# Patient Record
Sex: Male | Born: 1940 | Race: White | Hispanic: No | Marital: Married | Smoking: Former smoker
Health system: Southern US, Community
[De-identification: ages and names within clinical notes are randomized; demographics above are authoritative.]

## PROBLEM LIST (undated history)

## (undated) DIAGNOSIS — D494 Neoplasm of unspecified behavior of bladder: Secondary | ICD-10-CM

## (undated) DIAGNOSIS — I639 Cerebral infarction, unspecified: Secondary | ICD-10-CM

## (undated) DIAGNOSIS — I2510 Atherosclerotic heart disease of native coronary artery without angina pectoris: Secondary | ICD-10-CM

## (undated) DIAGNOSIS — I219 Acute myocardial infarction, unspecified: Secondary | ICD-10-CM

## (undated) DIAGNOSIS — I723 Aneurysm of iliac artery: Secondary | ICD-10-CM

## (undated) DIAGNOSIS — Z87442 Personal history of urinary calculi: Secondary | ICD-10-CM

## (undated) DIAGNOSIS — M1990 Unspecified osteoarthritis, unspecified site: Secondary | ICD-10-CM

## (undated) DIAGNOSIS — R51 Headache: Secondary | ICD-10-CM

## (undated) DIAGNOSIS — I10 Essential (primary) hypertension: Secondary | ICD-10-CM

## (undated) DIAGNOSIS — N210 Calculus in bladder: Secondary | ICD-10-CM

## (undated) DIAGNOSIS — C801 Malignant (primary) neoplasm, unspecified: Secondary | ICD-10-CM

## (undated) DIAGNOSIS — K219 Gastro-esophageal reflux disease without esophagitis: Secondary | ICD-10-CM

## (undated) DIAGNOSIS — E785 Hyperlipidemia, unspecified: Secondary | ICD-10-CM

## (undated) HISTORY — PX: OTHER SURGICAL HISTORY: SHX169

## (undated) HISTORY — PX: EYE SURGERY: SHX253

## (undated) HISTORY — DX: Aneurysm of iliac artery: I72.3

---

## 1997-09-13 HISTORY — PX: HERNIA REPAIR: SHX51

## 2009-09-13 DIAGNOSIS — I219 Acute myocardial infarction, unspecified: Secondary | ICD-10-CM

## 2009-09-13 HISTORY — PX: PROSTATECTOMY: SHX69

## 2009-09-13 HISTORY — DX: Acute myocardial infarction, unspecified: I21.9

## 2009-09-13 HISTORY — PX: CORONARY ANGIOPLASTY: SHX604

## 2009-12-03 ENCOUNTER — Inpatient Hospital Stay (HOSPITAL_COMMUNITY): Admission: RE | Admit: 2009-12-03 | Discharge: 2009-12-04 | Payer: Self-pay | Admitting: Urology

## 2009-12-03 ENCOUNTER — Encounter (INDEPENDENT_AMBULATORY_CARE_PROVIDER_SITE_OTHER): Payer: Self-pay | Admitting: Urology

## 2010-09-13 HISTORY — PX: CARDIAC CATHETERIZATION: SHX172

## 2010-12-07 LAB — HEMOGLOBIN AND HEMATOCRIT, BLOOD
HCT: 39.5 % (ref 39.0–52.0)
HCT: 45.7 % (ref 39.0–52.0)
Hemoglobin: 14.7 g/dL (ref 13.0–17.0)

## 2010-12-07 LAB — CBC
HCT: 44.6 % (ref 39.0–52.0)
MCHC: 32.8 g/dL (ref 30.0–36.0)
MCV: 85.5 fL (ref 78.0–100.0)
Platelets: 225 10*3/uL (ref 150–400)
RDW: 14.9 % (ref 11.5–15.5)
WBC: 6.7 10*3/uL (ref 4.0–10.5)

## 2010-12-07 LAB — BASIC METABOLIC PANEL
BUN: 14 mg/dL (ref 6–23)
CO2: 27 mEq/L (ref 19–32)
Chloride: 107 mEq/L (ref 96–112)
Creatinine, Ser: 0.8 mg/dL (ref 0.4–1.5)
GFR calc Af Amer: >60 mL/min (ref >60)
Glucose, Bld: 97 mg/dL (ref 70–99)
Potassium: 4.6 mEq/L (ref 3.5–5.1)

## 2010-12-07 LAB — ABO/RH: ABO/RH(D): A POS

## 2010-12-07 LAB — TYPE AND SCREEN: ABO/RH(D): A POS

## 2010-12-07 LAB — PROTIME-INR: Prothrombin Time: 14.2 seconds (ref 11.6–15.2)

## 2013-12-06 ENCOUNTER — Other Ambulatory Visit: Payer: Self-pay | Admitting: Urology

## 2013-12-11 ENCOUNTER — Encounter (HOSPITAL_COMMUNITY): Payer: Self-pay | Admitting: Pharmacy Technician

## 2013-12-18 ENCOUNTER — Encounter (HOSPITAL_COMMUNITY): Payer: Self-pay

## 2013-12-18 ENCOUNTER — Ambulatory Visit (HOSPITAL_COMMUNITY)
Admission: RE | Admit: 2013-12-18 | Discharge: 2013-12-18 | Disposition: A | Discharge status: Home / Self Care | Payer: Medicare Other | Source: Ambulatory Visit | Attending: Urology | Admitting: Urology

## 2013-12-18 ENCOUNTER — Encounter (INDEPENDENT_AMBULATORY_CARE_PROVIDER_SITE_OTHER): Payer: Self-pay

## 2013-12-18 ENCOUNTER — Encounter (HOSPITAL_COMMUNITY)
Admission: RE | Admit: 2013-12-18 | Discharge: 2013-12-18 | Disposition: A | Discharge status: Home / Self Care | Payer: Medicare Other | Source: Ambulatory Visit | Attending: Urology | Admitting: Urology

## 2013-12-18 DIAGNOSIS — I10 Essential (primary) hypertension: Secondary | ICD-10-CM | POA: Insufficient documentation

## 2013-12-18 DIAGNOSIS — I2510 Atherosclerotic heart disease of native coronary artery without angina pectoris: Secondary | ICD-10-CM | POA: Insufficient documentation

## 2013-12-18 HISTORY — DX: Atherosclerotic heart disease of native coronary artery without angina pectoris: I25.10

## 2013-12-18 HISTORY — DX: Hyperlipidemia, unspecified: E78.5

## 2013-12-18 HISTORY — DX: Neoplasm of unspecified behavior of bladder: D49.4

## 2013-12-18 HISTORY — DX: Acute myocardial infarction, unspecified: I21.9

## 2013-12-18 HISTORY — DX: Personal history of urinary calculi: Z87.442

## 2013-12-18 HISTORY — DX: Headache: R51

## 2013-12-18 HISTORY — DX: Calculus in bladder: N21.0

## 2013-12-18 HISTORY — DX: Gastro-esophageal reflux disease without esophagitis: K21.9

## 2013-12-18 HISTORY — DX: Unspecified osteoarthritis, unspecified site: M19.90

## 2013-12-18 HISTORY — DX: Essential (primary) hypertension: I10

## 2013-12-18 HISTORY — DX: Malignant (primary) neoplasm, unspecified: C80.1

## 2013-12-18 LAB — BASIC METABOLIC PANEL
BUN: 15 mg/dL (ref 6–23)
CHLORIDE: 102 meq/L (ref 96–112)
CO2: 27 mEq/L (ref 19–32)
Calcium: 9.5 mg/dL (ref 8.4–10.5)
Creatinine, Ser: 0.78 mg/dL (ref 0.50–1.35)
GFR calc non Af Amer: 87 mL/min — ABNORMAL LOW (ref >90)
GLUCOSE: 92 mg/dL (ref 70–99)
POTASSIUM: 4.7 meq/L (ref 3.7–5.3)
Sodium: 141 mEq/L (ref 137–147)

## 2013-12-18 NOTE — Progress Notes (Signed)
12/18/13 1147  [place]  Have you ever been diagnosed with sleep apnea through a sleep study? No  Do you snore loudly (loud enough to be heard through closed doors)?  0  Do you often feel tired, fatigued, or sleepy during the daytime? 1  Has anyone observed you stop breathing during your sleep? 0  Do you have, or are you being treated for high blood pressure? 1  BMI more than 35 kg/m2? 0  Age over 75 years old? 1  Neck circumference greater than 40 cm/18 inches? 0  Gender: 1  Obstructive Sleep Apnea Score 4  Score 4 or greater  Results sent to PCP

## 2013-12-18 NOTE — Patient Instructions (Addendum)
Samuel Curtis  12/18/2013                           YOUR PROCEDURE IS SCHEDULED ON: 12/24/13               PLEASE REPORT TO SHORT STAY CENTER AT : 1:45 PM               CALL THIS NUMBER IF ANY PROBLEMS THE DAY OF SURGERY :               [phone]                                REMEMBER:   Do not eat food or drink liquids AFTER MIDNIGHT   May have clear liquids UNTIL 6 HOURS BEFORE SURGERY (10:00 AM)               Take these medicines the morning of surgery with A SIP OF WATER: AMLODIPINE / ISORSORBIDE   Do not wear jewelry, make-up   Do not wear lotions, powders, or perfumes.   Do not shave legs or underarms 12 hrs. before surgery (men may shave face)  Do not bring valuables to the hospital.  Contacts, dentures or bridgework may not be worn into surgery.  Leave suitcase in the car. After surgery it may be brought to your room.  For patients admitted to the hospital more than one night, checkout time is            11:00 AM                                                       The day of discharge.   Patients discharged the day of surgery will not be allowed to drive home.            If going home same day of surgery, must have someone stay with you              FIRST 24 hrs at home and arrange for some one to drive you              home from hospital.    Special Instructions             Please read over the following fact sheets that you were given:               1. [place]                                                X_____________________________________________________________________        Failure to follow these instructions may result in cancellation of your surgery

## 2013-12-21 NOTE — H&P (Signed)
History of Present Illness Samuel Curtis is a 73 year old with the following urologic history:  1) Prostate cancer: He is s/p a UNS RAL radical prostatectomy on 12/03/09 for locally advanced prostate cancer with a positive surgical margin. He discussed the option of adjuvant radiation with Dr. Danny Lawless and chose to proceed with PSA surveillance and utilize radiation for salvage therapy if necessary.   TNM stage: pT3a N0 Mx  Gleason score: 3+4=7 Surgical margins: Positive (Focal in area of R EPE) Pretreatment PSA: 4.55 Pretreatment SHIM: 7   2) Microscopic hematuria: He initially developed asymptomatic microscopic hematuria in August 2013 which was not persistent when rechecked. He has denied gross hematuria and has no family history of urothelial cancer. He did smoke in the distant past but quit over 30 years ago.  Interval history:  Samuel Curtis presents today after recently having been seen just a few weeks ago. He developed new onset painless gross hematuria at the end of last week. This was fairly severe and caused him to immediately call our office. He states that he has been attempting his bladder well and denies any associated symptoms including no fever, flank pain, abdominal pain, urgency, frequency, or worsening dysuria. He does continue to have baseline dysuria which apparently has been stable past few years. This is not particularly bothersome to him. He did smoke approximately 1 pack per day for 20 years prior to quitting over 30 years ago. There is no family history of GU malignancy except for a brother with prostate cancer.     Of note, he recently saw his cardiologist and was told to discontinue both his Plavix and aspirin. He is currently not taking any antiplatelet therapy.   Past Medical History Problems  1. History of Acute Myocardial Infarction (V12.59) 2. History of glaucoma (V12.49) 3. History of hypertension (V12.59) 4. Prostate cancer (185)  Surgical  History Problems  1. History of Cardiac Cath Procedure Outcome: 2. History of Cataract Surgery 3. History of Cath Stent Placement 4. History of Inguinal Hernia Repair 5. History of Prostatectomy Robotic-Assisted  Current Meds 1. AmLODIPine Besylate TABS;  Therapy: (Recorded:07Feb2011) to Recorded 2. Aspirin 81 MG Oral Tablet;  Therapy: (Recorded:16Jan2013) to Recorded 3. Furosemide 20 MG Oral Tablet;  Therapy: 23Apr2014 to Recorded 4. Isosorbide Mononitrate ER 30 MG Oral Tablet Extended Release 24 Hour;  Therapy: 20Aug2012 to Recorded 5. Nitrostat 0.4 MG Sublingual Tablet Sublingual;  Therapy: (Recorded:23Dec2011) to Recorded 6. Plavix TABS;  Therapy: (Recorded:23Dec2011) to Recorded 7. Pravastatin Sodium 40 MG Oral Tablet;  Therapy: (Recorded:23Dec2011) to Recorded 8. Ranitidine HCl - 300 MG Oral Tablet;  Therapy: 13Sep2011 to Recorded  Allergies Medication  1. No Known Drug Allergies  Family History Problems  1. Family history of Acute Myocardial Infarction (V17.3) : Brother 2. Family history of Nephrolithiasis : Father 3. Family history of Prostate Cancer (B44.96) : Brother  Social History Problems  1. Denied: History of Alcohol Use 2. Marital History - Currently Married 3. Occupation:   retired 28. Tobacco Use (V15.82)   1 ppd x 20 years =quit 30 years  Review of Systems  Genitourinary: hematuria, but no urinary frequency and no feelings of urinary urgency.  Constitutional: no fever.  Hematologic/Lymphatic: no tendency to easily bruise.  Cardiovascular: no chest pain and no leg swelling.    Vitals Vital Signs [Data Includes: Last 1 Day]  Recorded: [id number] 01:26PM  Weight: 240 lb  BMI Calculated: 32.55 BSA Calculated: 2.3 Blood Pressure: 126 / 75 Heart Rate: 76  Physical Exam  Constitutional: Well nourished and well developed . No acute distress.  ENT:. The ears and nose are normal in appearance.  Neck: The appearance of the neck is normal and no  neck mass is present.  Pulmonary: No respiratory distress and normal respiratory rhythm and effort.  Cardiovascular: Heart rate and rhythm are normal . No peripheral edema.  Abdomen: The abdomen is soft and nontender. No masses are palpated. No CVA tenderness. No hernias are palpable. No hepatosplenomegaly noted.  Genitourinary: Examination of the penis demonstrates no discharge, no masses, no lesions and a normal meatus. The scrotum is without lesions. The right epididymis is palpably normal and non-tender. The left epididymis is palpably normal and non-tender. The right testis is non-tender and without masses. The left testis is non-tender and without masses.  Lymphatics: The femoral and inguinal nodes are not enlarged or tender.  Skin: Normal skin turgor, no visible rash and no visible skin lesions.  Neuro/Psych:. Mood and affect are appropriate.    Results/Data Urine [Data Includes: Last 1 Day]   [id number]  COLOR YELLOW   APPEARANCE CLEAR   SPECIFIC GRAVITY 1.025   pH 6.0   GLUCOSE NEG mg/dL  BILIRUBIN NEG   KETONE NEG mg/dL  BLOOD TRACE   PROTEIN NEG mg/dL  UROBILINOGEN 1 mg/dL  NITRITE NEG   LEUKOCYTE ESTERASE NEG   SQUAMOUS EPITHELIAL/HPF RARE   WBC 0-2 WBC/hpf  RBC 3-6 RBC/hpf  BACTERIA RARE   CRYSTALS NONE SEEN   CASTS NONE SEEN   Other AMORPHOUS    Procedure  Procedure: Cystoscopy  Chaperone Present: Heather S.   Indication: Hematuria.  Informed Consent: Risks, benefits, and potential adverse events were discussed and informed consent was obtained from the patient.  Prep: The patient was prepped with betadine.  Anesthesia:. Local anesthesia was administered intraurethrally with 2% lidocaine jelly.  Antibiotic prophylaxis: Ciprofloxacin.  Procedure Note:  Urethral meatus:. No abnormalities.  Anterior urethra: No abnormalities.  Prostatic urethra:. Surgically absent.  Bladder: Visulization was clear. The ureteral orifices were in the normal anatomic position  bilaterally and had clear efflux of urine. Inspection of the bladder was performed systematically. There were multiple abnormalities. At the bladder neck, he was noted to have a bladder calculus measuring approximately 1 cm. There was significantly edematous mucosa surrounding the bladder neck. On further evaluation of the bladder, there were multiple papillary bladder tumors seen throughout the bladder with at least 8-10 bladder tumors identified. The largest of these tumors were noted toward the dome of the bladder and measured approximately 2.5 centimeters. A saline bladder washing was obtained and sent for cytologic analysis. The patient tolerated the procedure well.  Complications: None.    Assessment Assessed  1. Gross hematuria (599.71) 2. 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 URINE CYTOLOGY; Status:Hold For - Specimen/Data Collection,Appointment;  Requested for:23Mar2015;  Gross hematuria  2. AU CT-HEMATURIA PROTOCOL; Status:Hold For - Appointment,PreCert,Date of  Service,Print; Requested for:23Mar2015;  3. CREATININE with eGFR; Status:In Progress - Specimen/Data Collected;   Done:  [id number] 5. Cysto; Status:Hold For - Appointment,Date of Service; Requested for:23Mar2015;  5. VENIPUNCTURE; Status:Complete;   Done: [id number] 6. Follow-up Office  Follow-up - will call to schedule surgery  Status: Hold For -  Appointment,Date of Service  Requested for: 23Mar2015 Health Maintenance  7. UA With REFLEX; [Do Not Release]; Status:Complete;   Done: [id number] 01:19PM  Discussion/Summary 1. Bladder tumors consistent with bladder cancer/gross hematuria : He will complete his hematuria evaluation including a CT scan of the abdomen and  pelvis. I have reviewed the findings from his cystoscopic procedure today and recommended that he proceed with cystoscopy under anesthesia, pelvic exam under anesthesia, transurethral resection of his bladder tumors, possible retrograde pyelography,  possible ureteral stent placement, and removal of his bladder neck calculus. We have reviewed the potential complications and expected recovery process associated with this procedure. He currently is off all antiplatelet agents per his cardiologist. He understands that we might modify our procedure based on his upcoming CT scan of his upper urinary tract. We briefly discussed the natural history of urothelial carcinoma of the bladder today and the likelihood that he will require further therapy. Even if he does have low grade tumors, considering the sheer number of tumors identified, I do think he will benefit from intravesical BCG therapy to help decrease his risk for tumor recurrence. I therefore will not plan to instill mitomycin C postoperatively.    2. Bladder neck calculus: This will be removed at the time of his upcoming cystoscopic procedure. This could potentially be related to an underlying foreign body and will be further evaluated in the operating room.      3. Prostate cancer: He is due for scheduled follow-up in 6 months which would be September 2015.    Cc: Dr. Reesa Chew     Verified Results AU CT-HEMATURIA PROTOCOL2 [id number] 12:00AM2 Anselm Pancoast  [Dec 06, 2013 1:34PM Aviela Blundell] Pt was informed of CT results. Please schedule patient for vascular surgery consult for right external iliac aneurysm. I have placed order. Please let me know who he will be seeing so I can dictate a note. Thanks.   Test Name Result Flag Reference  AU CT-HEMATURIA PROTOCOL2 (Report)2    ** RADIOLOGY REPORT BY [facility] **   CLINICAL DATA: Gross and microscopic hematuria. Started 1 week ago on lasted for 2 days. Prostatectomy for cancer in 2011.  EXAM: CT ABDOMEN AND PELVIS WITHOUT AND WITH CONTRAST  TECHNIQUE: Multidetector CT imaging of the abdomen and pelvis was performed without contrast material in one or both body regions, followed by contrast material(s) and further  sections in one or both body regions.  CONTRAST: 125 cc Isovue-300  COMPARISON: None.  FINDINGS: Lung Bases: Clear lung bases. Normal heart size without pericardial or pleural effusion. Multivessel coronary artery atherosclerosis.  Kidneys/Ureters/Bladder: No renal calculi or hydronephrosis. No hydroureter or ureteric calculi. An 8 mm lower pole left renal lesion demonstrates minimal calcification in its dependent portion on image 45/ series 3. Likely a minimally complex cyst. Too small to characterize interpolar right renal lesion which is likely a cyst.  Bilateral renal sinus cysts. Moderate renal collecting system opacification on delayed images. Moderate amount of on opacified urine within the bladder. Only moderate bladder distension. Left-sided bladder based calcification measures 4 mm on image 94/series 3. Favored to represent a bladder stone. Filling defect in this area on image 94/series 6 could represent edema or less likely an underlying soft tissue nodule. Subtle adjacent left-sided bladder filling defect on image 94/series 6.  Other: Normal liver, spleen, stomach. Pancreatic atrophy. Multiple gallstones without acute cholecystitis.  Normal adrenal glands. Aortic and branch vessel atherosclerosis. No retroperitoneal or retrocrural adenopathy. Extensive colonic diverticulosis. The appendix enters a right inguinal hernia, including on image 92/series 3. There is no inflammation identified. Normal small bowel without abdominal ascites.  Extensive wall thrombus within the right common iliac artery, which is aneurysmal a 1.9 cm on image 66/series 3. No pelvic adenopathy. History describes a prostatectomy. No evidence of  locally recurrent disease. No significant free fluid.  Bones/Musculoskeletal: No acute osseous abnormality. Degenerative disc disease at the lumbosacral junction.  IMPRESSION: 1. Calcification in the dependent left-sided bladder, favored  to represent a small stone. Cannot exclude an underlying soft tissue nodule or an adjacent smaller left-sided bladder nodule. Cystoscopy should be considered to exclude subtle transitional cell carcinoma. 2. No other explanation for hematuria. 3. Cholelithiasis. 4. Right common iliac artery aneurysm. 5. Noninflamed appendix within a right inguinal hernia.   Electronically Signed  By: Abigail Miyamoto M.D.  On: 12/06/2013 11:30   URINE CYTOLOGY1 [id number] 02:39PM1 Read Drivers  SPECIMEN TYPE: OTHER   Test Name Result Flag Reference  FINAL DIAGNOSIS:1  A1   - ABNORMAL FINDINGS. - ATYPICAL UROTHELIAL CELLS ARE PRESENT.  SOURCE:1 Bladder Washing1    120CC OF CLEAR YELLOW BLW RECEIVED 1 SLIDE PREPARED [id number] EM  Relevant Clinical Info1 188.[place]    PATHOLOGIST:1     REVIEWED BY VALERIE J. FIELDS, MD, FCAP (ELECTRONIC SIGNATURE ON FILE)  NUMBER OF SLIDES1     1 Container Submitted  CYTOTECHNOLOGIST:1     JWW, BS CT(ASCP), MLT(ASCP)     1. Amended By: Raynelle Bring; Dec 04 2013 5:24 PM EST  2. Amended By: Raynelle Bring; Dec 06 2013 1:34 PM EST  Signatures Electronically signed by : Raynelle Bring, M.D.; Dec 06 2013  1:34PM EST

## 2013-12-24 ENCOUNTER — Encounter (HOSPITAL_COMMUNITY)
Admission: RE | Disposition: A | Discharge status: Home / Self Care | Payer: Self-pay | Source: Ambulatory Visit | Attending: Urology

## 2013-12-24 ENCOUNTER — Encounter (HOSPITAL_COMMUNITY): Payer: Self-pay | Admitting: *Deleted

## 2013-12-24 ENCOUNTER — Ambulatory Visit (HOSPITAL_COMMUNITY): Payer: Medicare Other | Admitting: Anesthesiology

## 2013-12-24 ENCOUNTER — Encounter (HOSPITAL_COMMUNITY): Payer: Medicare Other | Admitting: Anesthesiology

## 2013-12-24 ENCOUNTER — Ambulatory Visit (HOSPITAL_COMMUNITY)
Admission: RE | Admit: 2013-12-24 | Discharge: 2013-12-24 | Disposition: A | Discharge status: Home / Self Care | Payer: Medicare Other | Source: Ambulatory Visit | Attending: Urology | Admitting: Urology

## 2013-12-24 DIAGNOSIS — R310 Gross hematuria: Secondary | ICD-10-CM | POA: Insufficient documentation

## 2013-12-24 DIAGNOSIS — Z7982 Long term (current) use of aspirin: Secondary | ICD-10-CM | POA: Insufficient documentation

## 2013-12-24 DIAGNOSIS — I10 Essential (primary) hypertension: Secondary | ICD-10-CM | POA: Insufficient documentation

## 2013-12-24 DIAGNOSIS — Z79899 Other long term (current) drug therapy: Secondary | ICD-10-CM | POA: Insufficient documentation

## 2013-12-24 DIAGNOSIS — C61 Malignant neoplasm of prostate: Secondary | ICD-10-CM | POA: Insufficient documentation

## 2013-12-24 DIAGNOSIS — K219 Gastro-esophageal reflux disease without esophagitis: Secondary | ICD-10-CM | POA: Insufficient documentation

## 2013-12-24 DIAGNOSIS — H409 Unspecified glaucoma: Secondary | ICD-10-CM | POA: Insufficient documentation

## 2013-12-24 DIAGNOSIS — N210 Calculus in bladder: Secondary | ICD-10-CM | POA: Insufficient documentation

## 2013-12-24 DIAGNOSIS — E669 Obesity, unspecified: Secondary | ICD-10-CM | POA: Insufficient documentation

## 2013-12-24 DIAGNOSIS — Z8042 Family history of malignant neoplasm of prostate: Secondary | ICD-10-CM | POA: Insufficient documentation

## 2013-12-24 DIAGNOSIS — I2510 Atherosclerotic heart disease of native coronary artery without angina pectoris: Secondary | ICD-10-CM | POA: Insufficient documentation

## 2013-12-24 DIAGNOSIS — Z9079 Acquired absence of other genital organ(s): Secondary | ICD-10-CM | POA: Insufficient documentation

## 2013-12-24 DIAGNOSIS — I252 Old myocardial infarction: Secondary | ICD-10-CM | POA: Insufficient documentation

## 2013-12-24 DIAGNOSIS — C671 Malignant neoplasm of dome of bladder: Secondary | ICD-10-CM | POA: Insufficient documentation

## 2013-12-24 DIAGNOSIS — Z87891 Personal history of nicotine dependence: Secondary | ICD-10-CM | POA: Insufficient documentation

## 2013-12-24 DIAGNOSIS — C679 Malignant neoplasm of bladder, unspecified: Secondary | ICD-10-CM | POA: Insufficient documentation

## 2013-12-24 HISTORY — PX: CYSTOSCOPY/RETROGRADE/URETEROSCOPY: SHX5316

## 2013-12-24 HISTORY — PX: TRANSURETHRAL RESECTION OF BLADDER TUMOR WITH GYRUS (TURBT-GYRUS): SHX6458

## 2013-12-24 SURGERY — TRANSURETHRAL RESECTION OF BLADDER TUMOR WITH GYRUS (TURBT-GYRUS)
Anesthesia: General

## 2013-12-24 MED ORDER — FENTANYL CITRATE 0.05 MG/ML IJ SOLN
INTRAMUSCULAR | Status: DC | PRN
  Administered 2013-12-24: 50 ug via INTRAVENOUS
  Administered 2013-12-24 (×2): 25 ug via INTRAVENOUS

## 2013-12-24 MED ORDER — CIPROFLOXACIN IN D5W 400 MG/200ML IV SOLN
400.0000 mg | INTRAVENOUS | Status: AC
  Administered 2013-12-24: 400 mg via INTRAVENOUS

## 2013-12-24 MED ORDER — HYDROCODONE-ACETAMINOPHEN 5-325 MG PO TABS: 1.0000 | ORAL_TABLET | Freq: Four times a day (QID) | ORAL | Status: DC | PRN

## 2013-12-24 MED ORDER — OXYBUTYNIN CHLORIDE 5 MG PO TABS
ORAL_TABLET | ORAL | Status: DC
Start: 2013-12-24 — End: 2013-12-24
  Filled 2013-12-24: qty 1

## 2013-12-24 MED ORDER — PROPOFOL 10 MG/ML IV BOLUS
INTRAVENOUS | Status: AC
  Filled 2013-12-24: qty 20

## 2013-12-24 MED ORDER — CIPROFLOXACIN HCL 500 MG PO TABS: 500.0000 mg | ORAL_TABLET | Freq: Two times a day (BID) | ORAL | Status: DC

## 2013-12-24 MED ORDER — LIDOCAINE HCL (CARDIAC) 20 MG/ML IV SOLN
INTRAVENOUS | Status: DC | PRN
  Administered 2013-12-24: 100 mg via INTRAVENOUS

## 2013-12-24 MED ORDER — FENTANYL CITRATE 0.05 MG/ML IJ SOLN: 25.0000 ug | INTRAMUSCULAR | Status: DC | PRN

## 2013-12-24 MED ORDER — FENTANYL CITRATE 0.05 MG/ML IJ SOLN
INTRAMUSCULAR | Status: AC
  Filled 2013-12-24: qty 2

## 2013-12-24 MED ORDER — LACTATED RINGERS IV SOLN
INTRAVENOUS | Status: DC
  Administered 2013-12-24: 1000 mL via INTRAVENOUS

## 2013-12-24 MED ORDER — EPHEDRINE SULFATE 50 MG/ML IJ SOLN
INTRAMUSCULAR | Status: DC | PRN
  Administered 2013-12-24: 5 mg via INTRAVENOUS

## 2013-12-24 MED ORDER — OXYBUTYNIN CHLORIDE 5 MG PO TABS
5.0000 mg | ORAL_TABLET | ORAL | Status: AC
  Administered 2013-12-24: 5 mg via ORAL

## 2013-12-24 MED ORDER — LIDOCAINE HCL (CARDIAC) 20 MG/ML IV SOLN
INTRAVENOUS | Status: AC
  Filled 2013-12-24: qty 5

## 2013-12-24 MED ORDER — SUCCINYLCHOLINE CHLORIDE 20 MG/ML IJ SOLN
INTRAMUSCULAR | Status: DC | PRN
  Administered 2013-12-24: 60 mg via INTRAVENOUS

## 2013-12-24 MED ORDER — PROPOFOL 10 MG/ML IV BOLUS
INTRAVENOUS | Status: DC | PRN
  Administered 2013-12-24: 50 mg via INTRAVENOUS
  Administered 2013-12-24: 100 mg via INTRAVENOUS
  Administered 2013-12-24: 200 mg via INTRAVENOUS

## 2013-12-24 MED ORDER — OXYBUTYNIN CHLORIDE 5 MG PO TABS: 5.0000 mg | ORAL_TABLET | Freq: Three times a day (TID) | ORAL | Status: DC | PRN

## 2013-12-24 MED ORDER — HYDROCODONE-ACETAMINOPHEN 5-325 MG PO TABS
1.0000 | ORAL_TABLET | Freq: Four times a day (QID) | ORAL | Status: DC | PRN
  Administered 2013-12-24: 2 via ORAL
  Filled 2013-12-24: qty 2

## 2013-12-24 MED ORDER — SODIUM CHLORIDE 0.9 % IR SOLN
Status: DC | PRN
  Administered 2013-12-24: 3000 mL via INTRAVESICAL

## 2013-12-24 MED ORDER — CIPROFLOXACIN IN D5W 400 MG/200ML IV SOLN
INTRAVENOUS | Status: AC
  Filled 2013-12-24: qty 200

## 2013-12-24 SURGICAL SUPPLY — 20 items
BAG URINE DRAINAGE (UROLOGICAL SUPPLIES) IMPLANT
BAG URO CATCHER STRL LF (DRAPE) ×4 IMPLANT
BASKET ZERO TIP NITINOL 2.4FR (BASKET) IMPLANT
CATH INTERMIT  6FR 70CM (CATHETERS) IMPLANT
DRAPE CAMERA CLOSED 9X96 (DRAPES) ×4 IMPLANT
ELECT LOOP MED HF 24F 12D CBL (CLIP) ×4 IMPLANT
ELECT REM PT RETURN 9FT ADLT (ELECTROSURGICAL)
ELECTRODE REM PT RTRN 9FT ADLT (ELECTROSURGICAL) IMPLANT
EVACUATOR MICROVAS BLADDER (UROLOGICAL SUPPLIES) IMPLANT
GLOVE BIOGEL M STRL SZ7.5 (GLOVE) ×4 IMPLANT
GOWN STRL REUS W/TWL LRG LVL3 (GOWN DISPOSABLE) ×8 IMPLANT
GUIDEWIRE ANG ZIPWIRE 038X150 (WIRE) IMPLANT
GUIDEWIRE STR DUAL SENSOR (WIRE) ×4 IMPLANT
KIT ASPIRATION TUBING (SET/KITS/TRAYS/PACK) IMPLANT
LOOPS RESECTOSCOPE DISP (ELECTROSURGICAL) IMPLANT
MANIFOLD NEPTUNE II (INSTRUMENTS) ×4 IMPLANT
PACK CYSTO (CUSTOM PROCEDURE TRAY) ×4 IMPLANT
SYRINGE IRR TOOMEY STRL 70CC (SYRINGE) ×4 IMPLANT
TUBING CONNECTING 10 (TUBING) ×3 IMPLANT
TUBING CONNECTING 10' (TUBING) ×1

## 2013-12-24 NOTE — Op Note (Signed)
Preoperative diagnosis: 1. Bladder tumors (largest 3 cm) 2. Bladder calculus  Postoperative diagnosis:  1. Bladder tumors (largest 3 cm) 2.   Bladder calculus  Procedure:  1. Cystoscopy 2. Transurethral removal of bladder calculus (1.5 cm) 3. Transurethral resection of bladder tumors (largest 3)  Surgeon: Roxy Horseman, Brooke Bonito. M.D.  Anesthesia: General  Complications: None  Intraoperative findings:  1. Bladder tumors: A total of 17 bladder tumors were identified.  They all appeared papillary and were mostly located near the dome of the bladder with another small cluster toward to the left lateral bladder wall.  EBL: Minimal  Specimens: 1. Bladder tumors at dome of bladder 2. Left lateral bladder tumors  Disposition of specimens: Pathology  Indication: Samuel Curtis is a patient who was found to have multiple bladder tumors and a bladder stone located at the bladder neck. After reviewing the management options for treatment, he elected to proceed with the above surgical procedure(s). We have discussed the potential benefits and risks of the procedure, side effects of the proposed treatment, the likelihood of the patient achieving the goals of the procedure, and any potential problems that might occur during the procedure or recuperation. Informed consent has been obtained.  Description of procedure:  The patient was taken to the operating room and general anesthesia was induced.  The patient was placed in the dorsal lithotomy position, prepped and draped in the usual sterile fashion, and preoperative antibiotics were administered. A preoperative time-out was performed.   Cystourethroscopy was performed.  The patient's urethra was examined and was normal with his prostate notable surgically absent considering his history of radical prostatectomy.  There was noted to be a bladder calculus measuring about 1.5 cm located at the bladder neck at 12 0'clock. It was removed with a  flexible grasper and underlying stapes were identified presumably that had eroded into his urethra following his prior prostatectomy.  These staples were removed also with the flexible graspers..   The bladder was then systematically examined in its entirety. The bladder was visualized with both a 12 and 70 lens.  There were multiple small papillary tumors noted throughout the bladder. In total, 17 bladder tumors were identified.  The largest conglomeration of bladder tumors was located near the dome of the bladder with the largest tumor measuring approximately 3 cm.  The remaining tumors in this area ranged from 0.5 cm up to 3 cm.  There were also multiple sporadic tumors located toward the left lateral bladder wall.  These measured 0.5 centimeters up to 2 cm.  The bladder was then re-examined after the resectoscope was placed.  Using bipolar loop cautery resection, all tumors were resected and removed for permanent pathologic analysis. The tumors at the dome of the bladder were first resected and sent as one specimen.  The tumors along the left lateral bladder wall were resected after he was paralyzed and were all sent as one specimen.  No bladder perforation was identified and there did appear to be adequate sampling of the underlying muscularis propria.  The bladder was then reinspected.  No further bladder tumors were identified.  Hemostasis was then achieved with the loop cautery and the bladder was emptied and reinspected with no further bleeding noted at the end of the procedure.    The bladder was then emptied and the procedure ended.  The patient appeared to tolerate the procedure well and without complications.  The patient was able to be awakened and transferred to the recovery unit in  satisfactory condition.    Pryor Curia MD

## 2013-12-24 NOTE — Discharge Instructions (Signed)

## 2013-12-24 NOTE — Interval H&P Note (Signed)
History and Physical Interval Note:  12/24/2013 3:23 PM  Samuel Curtis  has presented today for surgery, with the diagnosis of BLADDER TUMORS, BLADDER CALCULUS  The various methods of treatment have been discussed with the patient and family. After consideration of risks, benefits and other options for treatment, the patient has consented to  Procedure(s): TRANSURETHRAL RESECTION OF BLADDER TUMOR WITH GYRUS (TURBT-GYRUS) (N/A) CYSTOSCOPY WITH POSSIBLE RETROGRADE PYELOGRAM, EXAM UNDER ANESTHESIA, REMOVAL OF BLADDER CALCULUS (Bilateral) as a surgical intervention .  The patient's history has been reviewed, patient examined, no change in status, stable for surgery.  I have reviewed the patient's chart and labs.  Questions were answered to the patient's satisfaction.     [employer]

## 2013-12-24 NOTE — Transfer of Care (Signed)
Immediate Anesthesia Transfer of Care Note  Patient: MALEKAI MARKWOOD  Procedure(s) Performed: Procedure(s): TRANSURETHRAL RESECTION OF BLADDER TUMOR WITH GYRUS (TURBT-GYRUS) (N/A) CYSTOSCOPY EXAM UNDER ANESTHESIA, REMOVAL OF BLADDER CALCULUS (Bilateral)  Patient Location: PACU  Anesthesia Type:General  Level of Consciousness: awake  Airway & Oxygen Therapy: Patient Spontanous Breathing and Patient connected to face mask oxygen  Post-op Assessment: Report given to PACU RN and Post -op Vital signs reviewed and stable  Post vital signs: Reviewed and stable  Complications: No apparent anesthesia complications

## 2013-12-24 NOTE — Anesthesia Postprocedure Evaluation (Signed)
  Anesthesia Post-op Note  Patient: Samuel Curtis  Procedure(s) Performed: Procedure(s) (LRB): TRANSURETHRAL RESECTION OF BLADDER TUMOR WITH GYRUS (TURBT-GYRUS) (N/A) CYSTOSCOPY EXAM UNDER ANESTHESIA, REMOVAL OF BLADDER CALCULUS (Bilateral)  Patient Location: PACU  Anesthesia Type: General  Level of Consciousness: awake and alert   Airway and Oxygen Therapy: Patient Spontanous Breathing  Post-op Pain: mild  Post-op Assessment: Post-op Vital signs reviewed, Patient's Cardiovascular Status Stable, Respiratory Function Stable, Patent Airway and No signs of Nausea or vomiting  Last Vitals:  Filed Vitals:   12/24/13 1857  BP: 179/88  Pulse: 77  Temp: 36.2 C  Resp: 16    Post-op Vital Signs: stable   Complications: No apparent anesthesia complications

## 2013-12-24 NOTE — Anesthesia Preprocedure Evaluation (Signed)
Anesthesia Evaluation  Patient identified by MRN, date of birth, ID band Patient awake    Reviewed: Allergy & Precautions, H&P , NPO status , Patient's Chart, lab work & pertinent test results  Airway Mallampati: II TM Distance: >3 FB Neck ROM: Full    Dental no notable dental hx.    Pulmonary neg pulmonary ROS, former smoker,  breath sounds clear to auscultation  Pulmonary exam normal       Cardiovascular Exercise Tolerance: Good hypertension, Pt. on medications + CAD and + Past MI Rhythm:Regular Rate:Normal     Neuro/Psych  Headaches, negative psych ROS   GI/Hepatic Neg liver ROS, GERD-  ,  Endo/Other  negative endocrine ROS  Renal/GU negative Renal ROS  negative genitourinary   Musculoskeletal negative musculoskeletal ROS (+)   Abdominal (+) + obese,   Peds negative pediatric ROS (+)  Hematology negative hematology ROS (+)   Anesthesia Other Findings   Reproductive/Obstetrics negative OB ROS                           Anesthesia Physical Anesthesia Plan  ASA: III  Anesthesia Plan: General   Post-op Pain Management:    Induction: Intravenous  Airway Management Planned: LMA  Additional Equipment:   Intra-op Plan:   Post-operative Plan: Extubation in OR  Informed Consent: I have reviewed the patients History and Physical, chart, labs and discussed the procedure including the risks, benefits and alternatives for the proposed anesthesia with the patient or authorized representative who has indicated his/her understanding and acceptance.   Dental advisory given  Plan Discussed with: CRNA  Anesthesia Plan Comments:         Anesthesia Quick Evaluation

## 2013-12-25 ENCOUNTER — Encounter (HOSPITAL_COMMUNITY): Payer: Self-pay | Admitting: Urology

## 2013-12-26 ENCOUNTER — Encounter: Payer: Self-pay | Admitting: Vascular Surgery

## 2014-01-08 ENCOUNTER — Encounter: Payer: Self-pay | Admitting: Vascular Surgery

## 2014-01-10 ENCOUNTER — Other Ambulatory Visit: Payer: Self-pay | Admitting: Urology

## 2014-01-21 ENCOUNTER — Encounter: Payer: Self-pay | Admitting: Vascular Surgery

## 2014-01-22 ENCOUNTER — Encounter: Payer: Self-pay | Admitting: Vascular Surgery

## 2014-01-22 ENCOUNTER — Ambulatory Visit (INDEPENDENT_AMBULATORY_CARE_PROVIDER_SITE_OTHER): Payer: Medicare Other | Admitting: Vascular Surgery

## 2014-01-22 VITALS — BP 115/85 | HR 67 | Resp 16 | Ht 72.0 in | Wt 242.0 lb

## 2014-01-22 DIAGNOSIS — I723 Aneurysm of iliac artery: Secondary | ICD-10-CM

## 2014-01-22 NOTE — Progress Notes (Signed)
Subjective:     Patient ID: Samuel Curtis, male   DOB: 04-19-1941, 73 y.o.   MRN: [id number]  HPI this 73 year old male is referred by Dr. 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 Patient has a history of surgery for prostate cancer 4 years ago. Recently he developed some hematuria and CT scan was performed. 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 Patient has been asymptomatic from this. He was referred for further evaluation. He denies any symptoms of claudication, rest pain, nonhealing ulcers, or infection in the lower extremities. He has not smoked in many years.  Past Medical History  Diagnosis Date  . Coronary artery disease   . Myocardial infarction 2011  . Headache(784.0)     HX MIGRAINES  . Hypertension   . Hyperlipidemia   . Arthritis   . GERD (gastroesophageal reflux disease)   . Bladder tumor   . History of kidney stones   . Bladder calculus   . Cancer     History of skin cancer / bladder cancer / prostate  . Aneurysm artery, iliac common     History  Substance Use Topics  . Smoking status: Former Research scientist (life sciences)  . Smokeless tobacco: Former Systems developer    Quit date: 12/18/1973  . Alcohol Use: No    Family History  Problem Relation Age of Onset  . Heart attack Father   . COPD Brother     prostate    No Known Allergies  Current outpatient prescriptions:amLODipine (NORVASC) 5 MG tablet, Take 5 mg by mouth every morning., Disp: , Rfl: ;  furosemide (LASIX) 20 MG tablet, Take 20 mg by mouth every other day., Disp: , Rfl: ;  isosorbide dinitrate (ISORDIL) 30 MG tablet, Take 30 mg by mouth every morning., Disp: , Rfl: ;  pravastatin (PRAVACHOL) 40 MG tablet, Take 40 mg by mouth every evening. , Disp: , Rfl:  ranitidine (ZANTAC) 300 MG tablet, Take 300 mg by mouth every evening. , Disp: , Rfl: ;  aspirin 81 MG tablet, Take 81 mg by mouth daily., Disp: , Rfl: ;   ciprofloxacin (CIPRO) 500 MG tablet, Take 1 tablet (500 mg total) by mouth 2 (two) times daily., Disp: 6 tablet, Rfl: 0;  clopidogrel (PLAVIX) 75 MG tablet, , Disp: , Rfl:  HYDROcodone-acetaminophen (NORCO/VICODIN) 5-325 MG per tablet, Take 1-2 tablets by mouth every 6 (six) hours as needed., Disp: 25 tablet, Rfl: 0;  isosorbide mononitrate (IMDUR) 30 MG 24 hr tablet, , Disp: , Rfl: ;  oxybutynin (DITROPAN) 5 MG tablet, Take 1 tablet (5 mg total) by mouth every 8 (eight) hours as needed for bladder spasms., Disp: 30 tablet, Rfl: 0  BP 115/85  Pulse 67  Resp 16  Ht 6' (1.829 m)  Wt 242 lb (109.77 kg)  BMI 32.81 kg/m2  Body mass index is 32.81 kg/(m^2).        \   Review of Systems denies chest pain, dyspnea on exertion, PND, orthopnea, hemoptysis. Does have a history of migraine headaches quite frequently. Also recent hematuria. Denies any abdominal or back symptoms. Other systems negative and a complete review of systems. Does have a history of myocardial infarction 4 years ago     Objective:   Physical Exam BP 115/85  Pulse 67  Resp 16  Ht 6' (1.829 m)  Wt 242 lb (109.77 kg)  BMI 32.81 kg/m2  Gen.-alert and oriented x3 in  no apparent distress HEENT normal for age Lungs no rhonchi or wheezing Cardiovascular regular rhythm no murmurs carotid pulses 3+ palpable no bruits audible Abdomen soft nontender no palpable masses Musculoskeletal free of  major deformities Skin clear -no rashes Neurologic normal Lower extremities 3+ femoral and posterior tibial pulses palpable bilaterally with no edema  Today I reviewed the CT scan of the abdominal aorta and iliac arteries which was recently obtained and this does reveal a 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

## 2014-01-30 ENCOUNTER — Encounter (HOSPITAL_COMMUNITY): Payer: Self-pay | Admitting: Pharmacy Technician

## 2014-02-05 NOTE — Patient Instructions (Signed)
Samuel Curtis  02/05/2014   Your procedure is scheduled on:  02/11/2014   345pm-445pm  Report to [facility].  Follow the Signs to [place] at   145pm  Call this number if you have problems the morning of surgery: [phone]   Remember:   Do not eat food after midnite.               You may have clear liquids until 0900am the morning of surgery.    Take these medicines the morning of surgery with A SIP OF WATER:    Do not wear jewelry,   Do not wear lotions, powders, or perfumes.    Men may shave face and neck.  Do not bring valuables to the hospital.  Contacts, dentures or bridgework may not be worn into surgery.     Patients discharged the day of surgery will not be allowed to drive  home.  Name and phone number of your driver:       Please read over the following fact sheets that you were given:    CLEAR LIQUID DIET   Foods Allowed                                                                     Foods Excluded  Coffee and tea, regular and decaf                             liquids that you cannot  Plain Jell-O in any flavor                                             see through such as: Fruit ices (not with fruit pulp)                                     milk, soups, orange juice  Iced Popsicles                                    All solid food Carbonated beverages, regular and diet                                    Cranberry, grape and apple juices Sports drinks like Gatorade Lightly seasoned clear broth or consume(fat free) Sugar, honey syrup  Sample Menu Breakfast                                Lunch                                     Supper Cranberry juice  Beef broth                            Chicken broth Jell-O                                     Grape juice                           Apple juice Coffee or tea                        Jell-O                                      Popsicle                 Coffee or tea                        Coffee or tea  _____________________________________________________________________  [facility] - Preparing for Surgery Before surgery, you can play an important role.  Because skin is not sterile, your skin needs to be as free of germs as possible.  You can reduce the number of germs on your skin by washing with CHG (chlorahexidine gluconate) soap before surgery.  CHG is an antiseptic cleaner which kills germs and bonds with the skin to continue killing germs even after washing. Please DO NOT use if you have an allergy to CHG or antibacterial soaps.  If your skin becomes reddened/irritated stop using the CHG and inform your nurse when you arrive at Short Stay. Do not shave (including legs and underarms) for at least 48 hours prior to the first CHG shower.  You may shave your face/neck. Please follow these instructions carefully:  1.  Shower with CHG Soap the night before surgery and the  morning of Surgery.  2.  If you choose to wash your hair, wash your hair first as usual with your  normal  shampoo.  3.  After you shampoo, rinse your hair and body thoroughly to remove the  shampoo.                           4.  Use CHG as you would any other liquid soap.  You can apply chg directly  to the skin and wash                       Gently with a scrungie or clean washcloth.  5.  Apply the CHG Soap to your body ONLY FROM THE NECK DOWN.   Do not use on face/ open                           Wound or open sores. Avoid contact with eyes, ears mouth and genitals (private parts).                       Wash face,  Genitals (private parts) with your normal soap.             6.  Wash thoroughly, paying special attention to the area where your surgery  will be performed.  7.  Thoroughly rinse your body with warm water from the neck down.  8.  DO NOT shower/wash with your normal soap after using and rinsing off  the CHG Soap.                9.  Pat yourself dry  with a clean towel.            10.  Wear clean pajamas.            11.  Place clean sheets on your bed the night of your first shower and do not  sleep with pets. Day of Surgery : Do not apply any lotions/deodorants the morning of surgery.  Please wear clean clothes to the hospital/surgery center.  FAILURE TO FOLLOW THESE INSTRUCTIONS MAY RESULT IN THE CANCELLATION OF YOUR SURGERY PATIENT SIGNATURE_________________________________  NURSE SIGNATURE__________________________________  ________________________________________________________________________ , coughing and deep breathing exercises, leg exercises

## 2014-02-06 ENCOUNTER — Encounter (HOSPITAL_COMMUNITY): Payer: Self-pay

## 2014-02-06 ENCOUNTER — Encounter (HOSPITAL_COMMUNITY)
Admission: RE | Admit: 2014-02-06 | Discharge: 2014-02-06 | Disposition: A | Discharge status: Home / Self Care | Payer: Medicare Other | Source: Ambulatory Visit | Attending: Urology | Admitting: Urology

## 2014-02-06 DIAGNOSIS — Z01812 Encounter for preprocedural laboratory examination: Secondary | ICD-10-CM | POA: Insufficient documentation

## 2014-02-06 LAB — BASIC METABOLIC PANEL
BUN: 15 mg/dL (ref 6–23)
CALCIUM: 9.1 mg/dL (ref 8.4–10.5)
CO2: 26 mEq/L (ref 19–32)
Chloride: 101 mEq/L (ref 96–112)
Creatinine, Ser: 0.68 mg/dL (ref 0.50–1.35)
GLUCOSE: 107 mg/dL — AB (ref 70–99)
Potassium: 4.6 mEq/L (ref 3.7–5.3)
Sodium: 140 mEq/L (ref 137–147)

## 2014-02-06 LAB — CBC
HCT: 44.1 % (ref 39.0–52.0)
Hemoglobin: 14.6 g/dL (ref 13.0–17.0)
MCH: 28.5 pg (ref 26.0–34.0)
MCHC: 33.1 g/dL (ref 30.0–36.0)
MCV: 86.1 fL (ref 78.0–100.0)
PLATELETS: 167 10*3/uL (ref 150–400)
RBC: 5.12 MIL/uL (ref 4.22–5.81)
RDW: 14.2 % (ref 11.5–15.5)
WBC: 6.7 10*3/uL (ref 4.0–10.5)

## 2014-02-06 NOTE — Progress Notes (Signed)
EKG and CXR 12/18/13 EPIC  07/05/13- LOV Dr Luiz Ochoa on chart  ECHO 10/'23/13 on chart  Stress Test- 01/03/12 on chart

## 2014-02-11 ENCOUNTER — Encounter (HOSPITAL_COMMUNITY)
Admission: RE | Disposition: A | Discharge status: Home / Self Care | Payer: Self-pay | Source: Ambulatory Visit | Attending: Urology

## 2014-02-11 ENCOUNTER — Encounter (HOSPITAL_COMMUNITY): Payer: Medicare Other | Admitting: Anesthesiology

## 2014-02-11 ENCOUNTER — Encounter (HOSPITAL_COMMUNITY): Payer: Self-pay | Admitting: *Deleted

## 2014-02-11 ENCOUNTER — Ambulatory Visit (HOSPITAL_COMMUNITY)
Admission: RE | Admit: 2014-02-11 | Discharge: 2014-02-11 | Disposition: A | Discharge status: Home / Self Care | Payer: Medicare Other | Source: Ambulatory Visit | Attending: Urology | Admitting: Urology

## 2014-02-11 ENCOUNTER — Ambulatory Visit (HOSPITAL_COMMUNITY): Payer: Medicare Other | Admitting: Anesthesiology

## 2014-02-11 DIAGNOSIS — Z8546 Personal history of malignant neoplasm of prostate: Secondary | ICD-10-CM | POA: Insufficient documentation

## 2014-02-11 DIAGNOSIS — I252 Old myocardial infarction: Secondary | ICD-10-CM | POA: Insufficient documentation

## 2014-02-11 DIAGNOSIS — Z7902 Long term (current) use of antithrombotics/antiplatelets: Secondary | ICD-10-CM | POA: Insufficient documentation

## 2014-02-11 DIAGNOSIS — C679 Malignant neoplasm of bladder, unspecified: Secondary | ICD-10-CM | POA: Insufficient documentation

## 2014-02-11 DIAGNOSIS — Z87891 Personal history of nicotine dependence: Secondary | ICD-10-CM | POA: Insufficient documentation

## 2014-02-11 DIAGNOSIS — Z79899 Other long term (current) drug therapy: Secondary | ICD-10-CM | POA: Insufficient documentation

## 2014-02-11 DIAGNOSIS — Z9861 Coronary angioplasty status: Secondary | ICD-10-CM | POA: Insufficient documentation

## 2014-02-11 DIAGNOSIS — I2510 Atherosclerotic heart disease of native coronary artery without angina pectoris: Secondary | ICD-10-CM | POA: Insufficient documentation

## 2014-02-11 DIAGNOSIS — Z7982 Long term (current) use of aspirin: Secondary | ICD-10-CM | POA: Insufficient documentation

## 2014-02-11 DIAGNOSIS — Z8042 Family history of malignant neoplasm of prostate: Secondary | ICD-10-CM | POA: Insufficient documentation

## 2014-02-11 DIAGNOSIS — Z9079 Acquired absence of other genital organ(s): Secondary | ICD-10-CM | POA: Insufficient documentation

## 2014-02-11 DIAGNOSIS — K219 Gastro-esophageal reflux disease without esophagitis: Secondary | ICD-10-CM | POA: Insufficient documentation

## 2014-02-11 DIAGNOSIS — I10 Essential (primary) hypertension: Secondary | ICD-10-CM | POA: Insufficient documentation

## 2014-02-11 DIAGNOSIS — I739 Peripheral vascular disease, unspecified: Secondary | ICD-10-CM | POA: Insufficient documentation

## 2014-02-11 HISTORY — PX: CYSTOSCOPY WITH BIOPSY: SHX5122

## 2014-02-11 SURGERY — CYSTOSCOPY, WITH BIOPSY
Anesthesia: General

## 2014-02-11 MED ORDER — FENTANYL CITRATE 0.05 MG/ML IJ SOLN
INTRAMUSCULAR | Status: AC
  Filled 2014-02-11: qty 2

## 2014-02-11 MED ORDER — PROPOFOL 10 MG/ML IV BOLUS
INTRAVENOUS | Status: DC | PRN
  Administered 2014-02-11: 150 mg via INTRAVENOUS

## 2014-02-11 MED ORDER — HYDROCODONE-ACETAMINOPHEN 5-325 MG PO TABS: 1.0000 | ORAL_TABLET | Freq: Four times a day (QID) | ORAL | Status: DC | PRN

## 2014-02-11 MED ORDER — PHENAZOPYRIDINE HCL 100 MG PO TABS: 100.0000 mg | ORAL_TABLET | Freq: Three times a day (TID) | ORAL | Status: DC | PRN

## 2014-02-11 MED ORDER — FENTANYL CITRATE 0.05 MG/ML IJ SOLN: 25.0000 ug | INTRAMUSCULAR | Status: DC | PRN

## 2014-02-11 MED ORDER — FENTANYL CITRATE 0.05 MG/ML IJ SOLN
INTRAMUSCULAR | Status: DC | PRN
  Administered 2014-02-11: 50 ug via INTRAVENOUS
  Administered 2014-02-11: 25 ug via INTRAVENOUS

## 2014-02-11 MED ORDER — LACTATED RINGERS IV SOLN: INTRAVENOUS | Status: DC

## 2014-02-11 MED ORDER — LACTATED RINGERS IV SOLN
INTRAVENOUS | Status: DC | PRN
  Administered 2014-02-11: 15:00:00 via INTRAVENOUS

## 2014-02-11 MED ORDER — CIPROFLOXACIN IN D5W 400 MG/200ML IV SOLN
INTRAVENOUS | Status: AC
  Filled 2014-02-11: qty 200

## 2014-02-11 MED ORDER — PROPOFOL 10 MG/ML IV BOLUS
INTRAVENOUS | Status: AC
  Filled 2014-02-11: qty 20

## 2014-02-11 MED ORDER — CIPROFLOXACIN HCL 250 MG PO TABS: 250.0000 mg | ORAL_TABLET | Freq: Two times a day (BID) | ORAL | Status: DC

## 2014-02-11 MED ORDER — CIPROFLOXACIN IN D5W 400 MG/200ML IV SOLN
400.0000 mg | INTRAVENOUS | Status: AC
Start: 2014-02-11 — End: 2014-02-11
  Administered 2014-02-11: 400 mg via INTRAVENOUS

## 2014-02-11 MED ORDER — STERILE WATER FOR IRRIGATION IR SOLN
Status: DC | PRN
  Administered 2014-02-11: 2000 mL

## 2014-02-11 SURGICAL SUPPLY — 15 items
BAG URINE DRAINAGE (UROLOGICAL SUPPLIES) IMPLANT
BAG URO CATCHER STRL LF (DRAPE) ×3 IMPLANT
DRAPE CAMERA CLOSED 9X96 (DRAPES) ×3 IMPLANT
ELECT REM PT RETURN 9FT ADLT (ELECTROSURGICAL) ×3
ELECTRODE REM PT RTRN 9FT ADLT (ELECTROSURGICAL) ×1 IMPLANT
EVACUATOR MICROVAS BLADDER (UROLOGICAL SUPPLIES) IMPLANT
GLOVE BIOGEL M STRL SZ7.5 (GLOVE) ×3 IMPLANT
GOWN STRL REUS W/TWL LRG LVL3 (GOWN DISPOSABLE) ×3 IMPLANT
KIT ASPIRATION TUBING (SET/KITS/TRAYS/PACK) IMPLANT
LOOPS RESECTOSCOPE DISP (ELECTROSURGICAL) IMPLANT
MANIFOLD NEPTUNE II (INSTRUMENTS) ×3 IMPLANT
PACK CYSTO (CUSTOM PROCEDURE TRAY) ×3 IMPLANT
SYRINGE IRR TOOMEY STRL 70CC (SYRINGE) IMPLANT
TUBING CONNECTING 10 (TUBING) ×2 IMPLANT
TUBING CONNECTING 10' (TUBING) ×1

## 2014-02-11 NOTE — Op Note (Signed)
Preoperative diagnosis:  1. Bladder cancer  Postoperative diagnosis: 1. Bladder cancer  Procedure(s): 1. Cystoscopy  2. Transurethral resection of small bladder tumors (0.5 cm) 3. Bladder biopsies  Surgeon: Dr. Roxy Horseman, Jr  Anesthesia: General  Complications: None  EBL: Minimal  Specimens: 1. Bladder tumor at dome 2. Bladder tumor at left lateral wall 3. Bladder tumor at left trigone  Disposition of specimens: Pathology  Intraoperative findings: There were small recurrent papillary bladder tumors at the dome, left lateral wall, and left trigone. Healing prior resection sites were noted.  Indication: Samuel Curtis is a 73 year old with bladder cancer s/p TURBT a few weeks ago at which time about 17 bladder tumors were resected.  He presents today to undergo a repeat evaluation and restaging procedure with resection of any remaining tumors.  The potential risks, complications, and alternative options were discussed and informed consent obtained.  Description of procedure:  The patient was taken to the OR, given general anesthesia and prepped and draped in the usual sterile fashion.  He was given preoperative antibiotics and placed in the dorsal lithotomy position.  A preoperative time out was performed.   Cystourethroscopy was performed with a 30 and 70 degree lens. This revealed a normal anterior urethra.  There was noted to be moderate prostatic hyperplasia with bilateral lateral lobe hypertrophy.  Inspection of the bladder was performed systematically and demonstrated the ureteral orifices to be in their expected anatomic location and effluxing clear urine.  There were multiple healing sites from his prior resection. There were noted to be two small papillary tumors measuring 0.5 cm just lateral to the prior resection site at the dome, a small 0.5 cm papillary bladder tumor toward the left lateral bladder wall, and a small 0.5 cm papillary tumor at the left trigone.  Each  of these tumors was transurethrally resected with additional biopsies of the prior resection sites.  Hemostasis was achieved with bugbee fulguration.    The bladder was then emptied and reinspected. Hemostasis appeared excellent.  The bladder was emptied and the procedure ended.  He tolerated the procedure well and without complications.

## 2014-02-11 NOTE — H&P (Signed)
History of Present Illness Samuel Curtis is 73 years old with the following urologic history:    1) Prostate cancer: He is s/p a UNS RAL radical prostatectomy on 12/03/09 for locally advanced prostate cancer with a positive surgical margin. He discussed the option of adjuvant radiation with Dr. Danny Lawless and chose to proceed with PSA surveillance and utilize radiation for salvage therapy if necessary.     TNM stage: pT3a N0 Mx   Gleason score: 3+4=7  Surgical margins: Positive (Focal in area of R EPE)  Pretreatment PSA: 4.55  Pretreatment SHIM: 7    2) Urothelial carcinoma: He presented with gross hematuria in March 2015 and was found to have numerous bladder tumors throughout the bladder on cystoscopy. Upper tract imaging was unremarkable.    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    3) Bladder neck calculus: He was noted to have a bladder neck calculus and underlying foreign body (staple) and underwent endoscopic removal in April 2015.       Past Medical History Problems  1. History of Acute Myocardial Infarction (V12.59) 2. History of Common iliac aneurysm (442.2) 3. History of glaucoma (V12.49) 4. History of hypertension (V12.59) 5. Prostate cancer (185)  Surgical History Problems  1. History of Cardiac Cath Procedure Outcome: 2. History of Cataract Surgery 3. History of Cath Stent Placement 4. History of Inguinal Hernia Repair 5. History of Prostatectomy Robotic-Assisted  Current Meds 1. AmLODIPine Besylate TABS;  Therapy: (Recorded:07Feb2011) to Recorded 2. Aspirin 81 MG Oral Tablet;  Therapy: (Recorded:16Jan2013) to Recorded 3. Furosemide 20 MG Oral Tablet;  Therapy: 23Apr2014 to Recorded 4. Hydrocodone-Acetaminophen 5-325 MG Oral Tablet;  Therapy: 13Apr2015 to Recorded 5. Isosorbide Mononitrate ER 30 MG Oral Tablet Extended Release 24 Hour;  Therapy: 20Aug2012 to Recorded 6. Nitrostat 0.4 MG Sublingual  Tablet Sublingual;  Therapy: (Recorded:23Dec2011) to Recorded 7. Oxybutynin Chloride 5 MG Oral Tablet;  Therapy: 13Apr2015 to Recorded 8. Plavix TABS;  Therapy: (Recorded:23Dec2011) to Recorded 9. Pravastatin Sodium 40 MG Oral Tablet;  Therapy: (Recorded:23Dec2011) to Recorded 10. Ranitidine HCl - 300 MG Oral Tablet;   Therapy: 13Sep2011 to Recorded  Allergies Medication  1. No Known Drug Allergies  Family History Problems  1. Family history of Acute Myocardial Infarction (V17.3) : Brother 2. Family history of Nephrolithiasis : Father 3. Family history of Prostate Cancer (J19.14) : Brother  Social History Problems  1. Denied: History of Alcohol Use 2. Former smoker (V15.82) 3. Marital History - Currently Married 4. Occupation:   retired 76. Tobacco Use (V15.82)   1 ppd x 20 years =quit 30 years  Vitals  Weight: 242 lb  BMI Calculated: 32.82 BSA Calculated: 2.31   Physical Exam Constitutional: Well nourished and well developed . No acute distress.     Assessment Assessed  1. Bladder cancer (188.9)     Discussion/Summary 1. Urothelial carcinoma of the bladder : We reviewed his pathology result which indicates high-grade, Ta both at the dome and the left lateral resection sites. There was adequate muscularis propria removed at his prior resection and each site with no evidence of invasion. We had a discussion regarding options for treatment. Considering the numerous tumors that were removed at his initial resection, I did recommend that we proceed with a repeat transurethral resection/biopsy to ensure appropriate clinical staging and resection of all visible disease. Assuming that his clinical staging appears to be appropriate, I have recommended that he then proceeded with induction intravesical BCG. We have reviewed the  potential risks, complications, and the appropriate process involved with receiving BCG therapy with his induction treatment and also for maintenance  therapy. He also understands the need for frequent cystoscopic surveillance. He will undergo repeat cystoscopy and biopsy today. We have reviewed the potential risks and complications and expected recovery process.

## 2014-02-11 NOTE — Transfer of Care (Signed)
Immediate Anesthesia Transfer of Care Note  Patient: Samuel Curtis  Procedure(s) Performed: Procedure(s): CYSTOSCOPY TRANSURETHRAL RESECTION OF SMALL BLADDER TUMOR (N/A)  Patient Location: PACU  Anesthesia Type:General  Level of Consciousness: awake, sedated and patient cooperative  Airway & Oxygen Therapy: Patient Spontanous Breathing and Patient connected to face mask oxygen  Post-op Assessment: Report given to PACU RN and Post -op Vital signs reviewed and stable  Post vital signs: Reviewed and stable  Complications: No apparent anesthesia complications

## 2014-02-11 NOTE — Anesthesia Preprocedure Evaluation (Addendum)
Anesthesia Evaluation  Patient identified by MRN, date of birth, ID band Patient awake    Reviewed: Allergy & Precautions, H&P , NPO status , Patient's Chart, lab work & pertinent test results  Airway Mallampati: II TM Distance: >3 FB Neck ROM: full    Dental  (+) Chipped, Dental Advisory Given Chips on front upper:   Pulmonary neg pulmonary ROS, former smoker,  breath sounds clear to auscultation  Pulmonary exam normal       Cardiovascular Exercise Tolerance: Good hypertension, Pt. on medications + CAD, + Past MI, + Cardiac Stents and + Peripheral Vascular Disease Rhythm:regular Rate:Normal  MI 2011  S/p angioplasty and stents   Neuro/Psych negative neurological ROS  negative psych ROS   GI/Hepatic negative GI ROS, Neg liver ROS, GERD-  Medicated and Controlled,  Endo/Other  negative endocrine ROS  Renal/GU negative Renal ROS  negative genitourinary   Musculoskeletal   Abdominal   Peds  Hematology negative hematology ROS (+)   Anesthesia Other Findings   Reproductive/Obstetrics negative OB ROS                         Anesthesia Physical Anesthesia Plan  ASA: III  Anesthesia Plan: General   Post-op Pain Management:    Induction: Intravenous  Airway Management Planned: LMA  Additional Equipment:   Intra-op Plan:   Post-operative Plan:   Informed Consent: I have reviewed the patients History and Physical, chart, labs and discussed the procedure including the risks, benefits and alternatives for the proposed anesthesia with the patient or authorized representative who has indicated his/her understanding and acceptance.   Dental Advisory Given  Plan Discussed with: CRNA and Surgeon  Anesthesia Plan Comments:         Anesthesia Quick Evaluation

## 2014-02-11 NOTE — Discharge Instructions (Signed)
1. You may see some blood in the urine and may have some burning with urination for 48-72 hours. You also may notice that you have to urinate more frequently or urgently after your procedure which is normal.  °2. You should call should you develop an inability urinate, fever > 101, persistent nausea and vomiting that prevents you from eating or drinking to stay hydrated.  °

## 2014-02-11 NOTE — Anesthesia Postprocedure Evaluation (Signed)
  Anesthesia Post-op Note  Patient: Samuel Curtis  Procedure(s) Performed: Procedure(s) (LRB): CYSTOSCOPY TRANSURETHRAL RESECTION OF SMALL BLADDER TUMOR (N/A)  Patient Location: PACU  Anesthesia Type: General  Level of Consciousness: awake and alert   Airway and Oxygen Therapy: Patient Spontanous Breathing  Post-op Pain: mild  Post-op Assessment: Post-op Vital signs reviewed, Patient's Cardiovascular Status Stable, Respiratory Function Stable, Patent Airway and No signs of Nausea or vomiting  Last Vitals:  Filed Vitals:   02/11/14 1715  BP: 142/86  Pulse: 66  Temp:   Resp: 15    Post-op Vital Signs: stable   Complications: No apparent anesthesia complications

## 2014-02-12 ENCOUNTER — Encounter (HOSPITAL_COMMUNITY): Payer: Self-pay | Admitting: Urology

## 2014-02-25 NOTE — Progress Notes (Signed)
Chart reviewed for approved QI project.  

## 2014-08-29 ENCOUNTER — Other Ambulatory Visit: Payer: Self-pay | Admitting: Urology

## 2014-09-16 NOTE — Patient Instructions (Signed)
Samuel Curtis  09/16/2014   Your procedure is scheduled on: 09/23/2014    Report to [facility]  Entrance and follow signs to               [place] at    145pm  Call this number if you have problems the morning of surgery [phone]   Remember:  Do not eat food after midnite.  May have clear liquids until 0830am then nothing by mouth.       Take these medicines the morning of surgery with A SIP OF WATER: Amlodipine ( Norvasc), Imdur, Metoprolol ( Lopressor )                                You may not have any metal on your body including hair pins and              piercings  Do not wear jewelry, , lotions, powders or perfumes.                            Men may shave face and neck.   Do not bring valuables to the hospital. [place].  Contacts, dentures or bridgework may not be worn into surgery.  Leave suitcase in the car. After surgery it may be brought to your room.     Patients discharged the day of surgery will not be allowed to drive home.  Name and phone number of your driver:  Special Instructions:coughing and deep breathing exercises, leg exercises    CLEAR LIQUID DIET   Foods Allowed                                                                     Foods Excluded  Coffee and tea, regular and decaf                             liquids that you cannot  Plain Jell-O in any flavor                                             see through such as: Fruit ices (not with fruit pulp)                                     milk, soups, orange juice  Iced Popsicles                                    All solid food Carbonated beverages, regular and diet  Cranberry, grape and apple juices Sports drinks like Gatorade Lightly seasoned clear broth or consume(fat free) Sugar, honey syrup  Sample Menu Breakfast                                Lunch                                      Supper Cranberry juice                    Beef broth                            Chicken broth Jell-O                                     Grape juice                           Apple juice Coffee or tea                        Jell-O                                      Popsicle                                                Coffee or tea                        Coffee or tea  _____________________________________________________________________                Please read over the following fact sheets you were given: _____________________________________________________________________             [facility] - Preparing for Surgery Before surgery, you can play an important role.  Because skin is not sterile, your skin needs to be as free of germs as possible.  You can reduce the number of germs on your skin by washing with CHG (chlorahexidine gluconate) soap before surgery.  CHG is an antiseptic cleaner which kills germs and bonds with the skin to continue killing germs even after washing. Please DO NOT use if you have an allergy to CHG or antibacterial soaps.  If your skin becomes reddened/irritated stop using the CHG and inform your nurse when you arrive at Short Stay. Do not shave (including legs and underarms) for at least 48 hours prior to the first CHG shower.  You may shave your face/neck. Please follow these instructions carefully:  1.  Shower with CHG Soap the night before surgery and the  morning of Surgery.  2.  If you choose to wash your hair, wash your hair first as usual with your  normal  shampoo.  3.  After you shampoo, rinse your hair and body thoroughly to remove the  shampoo.  4.  Use CHG as you would any other liquid soap.  You can apply chg directly  to the skin and wash                       Gently with a scrungie or clean washcloth.  5.  Apply the CHG Soap to your body ONLY FROM THE NECK DOWN.   Do not use on face/ open                            Wound or open sores. Avoid contact with eyes, ears mouth and genitals (private parts).                       Wash face,  Genitals (private parts) with your normal soap.             6.  Wash thoroughly, paying special attention to the area where your surgery  will be performed.  7.  Thoroughly rinse your body with warm water from the neck down.  8.  DO NOT shower/wash with your normal soap after using and rinsing off  the CHG Soap.                9.  Pat yourself dry with a clean towel.            10.  Wear clean pajamas.            11.  Place clean sheets on your bed the night of your first shower and do not  sleep with pets. Day of Surgery : Do not apply any lotions/deodorants the morning of surgery.  Please wear clean clothes to the hospital/surgery center.  FAILURE TO FOLLOW THESE INSTRUCTIONS MAY RESULT IN THE CANCELLATION OF YOUR SURGERY PATIENT SIGNATURE_________________________________  NURSE SIGNATURE__________________________________  ________________________________________________________________________

## 2014-09-17 ENCOUNTER — Encounter (HOSPITAL_COMMUNITY)
Admission: RE | Admit: 2014-09-17 | Discharge: 2014-09-17 | Disposition: A | Discharge status: Home / Self Care | Payer: Medicare Other | Source: Ambulatory Visit | Attending: Urology | Admitting: Urology

## 2014-09-17 ENCOUNTER — Encounter (HOSPITAL_COMMUNITY): Payer: Self-pay

## 2014-09-17 DIAGNOSIS — Z8673 Personal history of transient ischemic attack (TIA), and cerebral infarction without residual deficits: Secondary | ICD-10-CM | POA: Diagnosis not present

## 2014-09-17 DIAGNOSIS — I252 Old myocardial infarction: Secondary | ICD-10-CM | POA: Diagnosis not present

## 2014-09-17 DIAGNOSIS — I10 Essential (primary) hypertension: Secondary | ICD-10-CM | POA: Diagnosis not present

## 2014-09-17 DIAGNOSIS — Z95818 Presence of other cardiac implants and grafts: Secondary | ICD-10-CM | POA: Insufficient documentation

## 2014-09-17 DIAGNOSIS — Z01818 Encounter for other preprocedural examination: Secondary | ICD-10-CM | POA: Insufficient documentation

## 2014-09-17 HISTORY — DX: Cerebral infarction, unspecified: I63.9

## 2014-09-17 LAB — CBC
HEMATOCRIT: 45.6 % (ref 39.0–52.0)
Hemoglobin: 14.9 g/dL (ref 13.0–17.0)
MCH: 28.9 pg (ref 26.0–34.0)
MCHC: 32.7 g/dL (ref 30.0–36.0)
MCV: 88.4 fL (ref 78.0–100.0)
Platelets: 210 10*3/uL (ref 150–400)
RBC: 5.16 MIL/uL (ref 4.22–5.81)
RDW: 14.3 % (ref 11.5–15.5)
WBC: 7.5 10*3/uL (ref 4.0–10.5)

## 2014-09-17 LAB — BASIC METABOLIC PANEL
Anion gap: 5 (ref 5–15)
BUN: 12 mg/dL (ref 6–23)
CO2: 29 mmol/L (ref 19–32)
CREATININE: 0.81 mg/dL (ref 0.50–1.35)
Calcium: 8.9 mg/dL (ref 8.4–10.5)
Chloride: 105 mEq/L (ref 96–112)
GFR calc Af Amer: >90 mL/min (ref >90)
GFR, EST NON AFRICAN AMERICAN: 85 mL/min — AB (ref >90)
Glucose, Bld: 88 mg/dL (ref 70–99)
Potassium: 5 mmol/L (ref 3.5–5.1)
Sodium: 139 mmol/L (ref 135–145)

## 2014-09-17 NOTE — Progress Notes (Addendum)
EKG- and CXR 12/18/13 EPIC  DR Dwyane Dee- 08/20/14 LOV note on chart  Cath REport 2011 on chart  DR Luiz Ochoa ( cards) - 07/30/14 LOV note on chart  Stress 2013 on chart  ECHO-2013 on chart  ECHO - 02/2014 within Dr Luiz Ochoa note of 07/2014 on chart   Requested information related to [facility] hospitalization of 02/2014 related to stroke.  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

## 2014-09-17 NOTE — Progress Notes (Signed)
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  LOV with PCp- Dr Dwyane Dee- 08/20/2014 on chart, DR Luiz Ochoa- LOV- 07/30/2014 on chart , Stre4ss 2013 on chart, ECHO- 2013 on chart and ECHO- 2015 in note, Cath report 2011 on chart.  No new orders given.  Patient was taken off of Plavix by [facility] and placed on Aspirin.  Patient stated at time of preop appointment he was to stay on Aspirin per MD instructions.  No new orders given.

## 2014-09-20 NOTE — H&P (Signed)
History of Present Illness Mr. Podolak is 74 years old with the following urologic history:    1) Prostate cancer: He is s/p a UNS RAL radical prostatectomy on 12/03/09 for locally advanced prostate cancer with a positive surgical margin. He discussed the option of adjuvant radiation with Dr. Danny Lawless and chose to proceed with PSA surveillance and utilize radiation for salvage therapy if necessary.     TNM stage: pT3a N0 Mx   Gleason score: 3+4=7  Surgical margins: Positive (Focal in area of R EPE)  Pretreatment PSA: 4.55  Pretreatment SHIM: 7    2) Urothelial carcinoma: He presented with gross hematuria in March 2015 and was found to have numerous bladder tumors throughout the bladder on cystoscopy. Upper tract imaging was unremarkable.    Apr 2015: TUR - High grade Ta (high volume with multiple tumors)  Jun 2015: TUR - repeat resection due to high volume of tumors, persistent small amount of high grade Ta  Jul-Aug 2015: 6 week induction BCG  Oct 2015: Maintenance BCG    3) Bladder neck calculus: He was noted to have a bladder neck calculus and underlying foreign body (staple) and underwent endoscopic removal in April 2015.    Interval history:    He follows up today for cystoscopic surveillance of his bladder cancer after completing maintenance BCG for 3 weeks after his last visit. He tolerated therapy fairly well and denies any significant side effects although he has noted some mild incontinence at times.     Past Medical History Problems  1. History of Acute Myocardial Infarction 2. 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 History of hypertension (Z86.79) 5. History of stroke (Z86.73) 6. Prostate cancer (C61)  Surgical History Problems  1. History of Cardiac Cath Procedure Outcome: 2. History of Cataract Surgery 3. History of Cath Stent Placement 4. History of Cystoscopy With Fragmentation Of Bladder Calculus 5.  History of Cystoscopy With Fulguration Medium Lesion (2-5cm) 6. History of Cystoscopy With Fulguration Small Lesion (5-11mm) 7. History of Inguinal Hernia Repair 8. History of Prostatectomy Robotic-Assisted  Current Meds 1. AmLODIPine Besylate TABS;  Therapy: (Recorded:07Feb2011) to Recorded 2. Aspirin 81 MG Oral Tablet;  Therapy: (Recorded:16Jan2013) to Recorded 3. Furosemide 20 MG Oral Tablet;  Therapy: 23Apr2014 to Recorded 4. Hydrocodone-Acetaminophen 5-325 MG Oral Tablet;  Therapy: 13Apr2015 to Recorded 5. Isosorbide Mononitrate ER 30 MG Oral Tablet Extended Release 24 Hour;  Therapy: 20Aug2012 to Recorded 6. Nitrostat 0.4 MG Sublingual Tablet Sublingual;  Therapy: (Recorded:23Dec2011) to Recorded 7. Oxybutynin Chloride 5 MG Oral Tablet;  Therapy: 13Apr2015 to Recorded 8. Plavix TABS;  Therapy: (Recorded:23Dec2011) to Recorded 9. Pravastatin Sodium 40 MG Oral Tablet;  Therapy: (Recorded:23Dec2011) to Recorded 10. Ranitidine HCl - 300 MG Oral Tablet;   Therapy: 13Sep2011 to Recorded  Allergies Medication  1. No Known Drug Allergies  Family History Problems  1. Family history of Acute Myocardial Infarction : Brother 2. Family history of Nephrolithiasis : Father 3. Family history of Prostate Cancer : Brother  Social History Problems  1. Denied: History of Alcohol Use 2. Former smoker [phone]) 3. Marital History - Currently Married 4. Occupation:   retired 85. Tobacco Use   1 ppd x 20 years =quit 30 years  Vitals Vital Signs [Data Includes: Last 1 Day]  Recorded: [id number] 01:02PM  Blood Pressure: 108 / 67 Heart Rate: 65  Physical Exam Constitutional: Well nourished and well developed . No acute distress.  Genitourinary: Examination of the penis demonstrates no lesions and  a normal meatus.    Results/Data Urine [Data Includes: Last 1 Day]   [id number]  COLOR AMBER   APPEARANCE CLOUDY   SPECIFIC GRAVITY 1.025   pH 5.5   GLUCOSE NEG mg/dL  BILIRUBIN  NEG   KETONE NEG mg/dL  BLOOD LARGE   PROTEIN NEG mg/dL  UROBILINOGEN 0.2 mg/dL  NITRITE NEG   LEUKOCYTE ESTERASE NEG   SQUAMOUS EPITHELIAL/HPF RARE   WBC NONE SEEN WBC/hpf  RBC 7-10 RBC/hpf  BACTERIA FEW   CRYSTALS Calcium Oxalate crystals noted   CASTS NONE SEEN   Other MUCUS    Procedure  Procedure: Cystoscopy  Chaperone Present: Heather S.  Indication: History of Urothelial Carcinoma.  Informed Consent: Risks, benefits, and potential adverse events were discussed and informed consent was obtained from the patient.  Prep: The patient was prepped with betadine.  Anesthesia:. Local anesthesia was administered intraurethrally with 2% lidocaine jelly.  Antibiotic prophylaxis: Ciprofloxacin.  Procedure Note:  Urethral meatus:. No abnormalities.  Anterior urethra: No abnormalities.  Prostatic urethra:. Surgically absent.  Bladder: Visulization was clear. The ureteral orifices were in the normal anatomic position bilaterally and had clear efflux of urine. Systematic examination of the bladder revealed a small papillary tumor measuring about 0.5 cm just lateral to the left ureteral orifice. This did appear papillary most consistent with recurrent tumor. No other tumors were seen throughout the bladder. He did appear to have a small bladder stone that was identified within the bladder. A saline bladder washing was obtained and sent for cytologic analysis. The patient tolerated the procedure well.  Complications: None.    Assessment Assessed  1. Prostate cancer (C61)  Plan Health Maintenance  1. UA With REFLEX; [Do Not Release]; Status:Complete;   Done: 01EOF1219 12:50PM Malignant neoplasm of overlapping sites of bladder  2. URINE CYTOLOGY W/REFLEX FISH; [Do Not Release]; Status:In Progress -  Specimen/Data Collected;   Done: [id number]  Discussion/Summary 1. Bladder cancer: Cystoscopic findings are concerning for a small early recurrence near the left ureteral orifice. I  recommended cystoscopy, transurethral resection of his bladder tumor, and left ureteral stent placement considering the close proximity to the ureteral orifice. We reviewed the potential risks, complications, and expected recovery process associated with this procedure. This will be scheduled for the near future.    2. Prostate cancer: His next PSA would be due for around March 2016.    Cc: Dr. Reesa Chew     Verified Results URINE CYTOLOGY W/REFLEX FISH1 [id number] 02:12PM1 Read Drivers  SPECIMEN TYPE: OTHER   Test Name Result Flag Reference  FINAL DIAGNOSIS:1     - NO MALIGNANT CELLS IDENTIFIED. - BENIGN UROTHELIAL CELLS ARE PRESENT. RBC'S ARE PRESENT.  NUMBER OF SLIDES1     1 Container Submitted  SOURCE:1 Bladder Washing1    60CC IN CUP1, 60CC IN CUP2 OF CLEAR YELLOW BLW RECEIVED 1 SLIDE PREPARED [id number] EM  Relevant Clinical Info1     MALIGNANT NEOPLASM OF OVERLAPPING SITES OF BLADDER  CYTOTECHNOLOGIST:1     EW, BA CT(ASCP)  PATHOLOGIST:1     REVIEWED BY S. SERDAR DEMIRCI, MD, (ELECTRONIC SIGNATURE ON FILE)     1. Amended By: Raynelle Bring; Aug 29 2014 4:49 PM EST  Signatures Electronically signed by : Raynelle Bring, M.D.; Aug 29 2014  4:49PM EST

## 2014-09-23 ENCOUNTER — Encounter (HOSPITAL_COMMUNITY)
Admission: RE | Disposition: A | Discharge status: Home / Self Care | Payer: Self-pay | Source: Ambulatory Visit | Attending: Urology

## 2014-09-23 ENCOUNTER — Ambulatory Visit (HOSPITAL_COMMUNITY)
Admission: RE | Admit: 2014-09-23 | Discharge: 2014-09-23 | Disposition: A | Discharge status: Home / Self Care | Payer: Medicare Other | Source: Ambulatory Visit | Attending: Urology | Admitting: Urology

## 2014-09-23 ENCOUNTER — Encounter (HOSPITAL_COMMUNITY): Payer: Self-pay | Admitting: *Deleted

## 2014-09-23 ENCOUNTER — Ambulatory Visit (HOSPITAL_COMMUNITY): Payer: Medicare Other | Admitting: Certified Registered Nurse Anesthetist

## 2014-09-23 ENCOUNTER — Ambulatory Visit (HOSPITAL_COMMUNITY): Payer: Medicare Other

## 2014-09-23 DIAGNOSIS — K219 Gastro-esophageal reflux disease without esophagitis: Secondary | ICD-10-CM | POA: Diagnosis not present

## 2014-09-23 DIAGNOSIS — C61 Malignant neoplasm of prostate: Secondary | ICD-10-CM | POA: Insufficient documentation

## 2014-09-23 DIAGNOSIS — I2510 Atherosclerotic heart disease of native coronary artery without angina pectoris: Secondary | ICD-10-CM | POA: Insufficient documentation

## 2014-09-23 DIAGNOSIS — I10 Essential (primary) hypertension: Secondary | ICD-10-CM | POA: Diagnosis not present

## 2014-09-23 DIAGNOSIS — I739 Peripheral vascular disease, unspecified: Secondary | ICD-10-CM | POA: Diagnosis not present

## 2014-09-23 DIAGNOSIS — Z8550 Personal history of malignant neoplasm of unspecified urinary tract organ: Secondary | ICD-10-CM | POA: Diagnosis not present

## 2014-09-23 DIAGNOSIS — Z87891 Personal history of nicotine dependence: Secondary | ICD-10-CM | POA: Insufficient documentation

## 2014-09-23 DIAGNOSIS — M1990 Unspecified osteoarthritis, unspecified site: Secondary | ICD-10-CM | POA: Insufficient documentation

## 2014-09-23 DIAGNOSIS — C679 Malignant neoplasm of bladder, unspecified: Secondary | ICD-10-CM | POA: Diagnosis present

## 2014-09-23 DIAGNOSIS — Z8673 Personal history of transient ischemic attack (TIA), and cerebral infarction without residual deficits: Secondary | ICD-10-CM | POA: Diagnosis not present

## 2014-09-23 DIAGNOSIS — I252 Old myocardial infarction: Secondary | ICD-10-CM | POA: Insufficient documentation

## 2014-09-23 DIAGNOSIS — Z7982 Long term (current) use of aspirin: Secondary | ICD-10-CM | POA: Insufficient documentation

## 2014-09-23 DIAGNOSIS — C678 Malignant neoplasm of overlapping sites of bladder: Secondary | ICD-10-CM | POA: Diagnosis not present

## 2014-09-23 DIAGNOSIS — Z79899 Other long term (current) drug therapy: Secondary | ICD-10-CM | POA: Insufficient documentation

## 2014-09-23 HISTORY — PX: CYSTOSCOPY WITH STENT PLACEMENT: SHX5790

## 2014-09-23 HISTORY — PX: TRANSURETHRAL RESECTION OF BLADDER TUMOR: SHX2575

## 2014-09-23 SURGERY — TURBT (TRANSURETHRAL RESECTION OF BLADDER TUMOR)
Anesthesia: General

## 2014-09-23 MED ORDER — PROPOFOL 10 MG/ML IV BOLUS
INTRAVENOUS | Status: DC | PRN
  Administered 2014-09-23: 150 mg via INTRAVENOUS

## 2014-09-23 MED ORDER — CIPROFLOXACIN IN D5W 400 MG/200ML IV SOLN
400.0000 mg | INTRAVENOUS | Status: AC
  Administered 2014-09-23: 400 mg via INTRAVENOUS

## 2014-09-23 MED ORDER — SODIUM CHLORIDE 0.9 % IR SOLN
Status: DC | PRN
  Administered 2014-09-23: 3000 mL

## 2014-09-23 MED ORDER — HYDROMORPHONE HCL 1 MG/ML IJ SOLN
INTRAMUSCULAR | Status: AC
  Filled 2014-09-23: qty 1

## 2014-09-23 MED ORDER — CIPROFLOXACIN HCL 250 MG PO TABS: 250.0000 mg | ORAL_TABLET | Freq: Two times a day (BID) | ORAL | Status: DC

## 2014-09-23 MED ORDER — LACTATED RINGERS IV SOLN
INTRAVENOUS | Status: DC | PRN
  Administered 2014-09-23: 13:00:00 via INTRAVENOUS

## 2014-09-23 MED ORDER — FENTANYL CITRATE 0.05 MG/ML IJ SOLN
INTRAMUSCULAR | Status: AC
  Filled 2014-09-23: qty 2

## 2014-09-23 MED ORDER — HYDROCODONE-ACETAMINOPHEN 5-325 MG PO TABS: 1.0000 | ORAL_TABLET | Freq: Four times a day (QID) | ORAL | Status: DC | PRN

## 2014-09-23 MED ORDER — ONDANSETRON HCL 4 MG/2ML IJ SOLN
INTRAMUSCULAR | Status: AC
  Filled 2014-09-23: qty 2

## 2014-09-23 MED ORDER — CIPROFLOXACIN IN D5W 400 MG/200ML IV SOLN
INTRAVENOUS | Status: AC
  Filled 2014-09-23: qty 200

## 2014-09-23 MED ORDER — HYDROMORPHONE HCL 1 MG/ML IJ SOLN
0.2500 mg | INTRAMUSCULAR | Status: DC | PRN
  Administered 2014-09-23 (×2): 0.5 mg via INTRAVENOUS

## 2014-09-23 MED ORDER — PHENAZOPYRIDINE HCL 100 MG PO TABS
100.0000 mg | ORAL_TABLET | Freq: Once | ORAL | Status: DC | PRN
  Filled 2014-09-23: qty 1

## 2014-09-23 MED ORDER — 0.9 % SODIUM CHLORIDE (POUR BTL) OPTIME
TOPICAL | Status: DC | PRN
  Administered 2014-09-23: 1000 mL

## 2014-09-23 MED ORDER — ONDANSETRON HCL 4 MG/2ML IJ SOLN
INTRAMUSCULAR | Status: DC | PRN
  Administered 2014-09-23: 4 mg via INTRAVENOUS

## 2014-09-23 MED ORDER — LACTATED RINGERS IV SOLN
INTRAVENOUS | Status: DC
  Administered 2014-09-23: 16:00:00 via INTRAVENOUS

## 2014-09-23 MED ORDER — IOHEXOL 300 MG/ML  SOLN
INTRAMUSCULAR | Status: DC | PRN
  Administered 2014-09-23: 10 mL

## 2014-09-23 MED ORDER — PHENAZOPYRIDINE HCL 100 MG PO TABS: 100.0000 mg | ORAL_TABLET | Freq: Three times a day (TID) | ORAL | Status: DC | PRN

## 2014-09-23 MED ORDER — PROPOFOL 10 MG/ML IV BOLUS
INTRAVENOUS | Status: AC
  Filled 2014-09-23: qty 20

## 2014-09-23 MED ORDER — LACTATED RINGERS IV SOLN
INTRAVENOUS | Status: DC
  Administered 2014-09-23: 1000 mL via INTRAVENOUS

## 2014-09-23 MED ORDER — PROMETHAZINE HCL 25 MG/ML IJ SOLN: 6.2500 mg | INTRAMUSCULAR | Status: DC | PRN

## 2014-09-23 MED ORDER — FENTANYL CITRATE 0.05 MG/ML IJ SOLN
INTRAMUSCULAR | Status: DC | PRN
  Administered 2014-09-23: 25 ug via INTRAVENOUS
  Administered 2014-09-23: 50 ug via INTRAVENOUS

## 2014-09-23 MED ORDER — HYDROCODONE-ACETAMINOPHEN 5-325 MG PO TABS: 1.0000 | ORAL_TABLET | Freq: Once | ORAL | Status: DC | PRN

## 2014-09-23 MED ORDER — MEPERIDINE HCL 50 MG/ML IJ SOLN: 6.2500 mg | INTRAMUSCULAR | Status: DC | PRN

## 2014-09-23 SURGICAL SUPPLY — 19 items
BAG URINE DRAINAGE (UROLOGICAL SUPPLIES) IMPLANT
BAG URO CATCHER STRL LF (DRAPE) ×4 IMPLANT
CATH INTERMIT  6FR 70CM (CATHETERS) ×4 IMPLANT
DRAPE CAMERA CLOSED 9X96 (DRAPES) ×4 IMPLANT
ELECT LOOP MED HF 24F 12D CBL (CLIP) ×4 IMPLANT
ELECT REM PT RETURN 9FT ADLT (ELECTROSURGICAL) ×4
ELECTRODE REM PT RTRN 9FT ADLT (ELECTROSURGICAL) ×2 IMPLANT
EVACUATOR MICROVAS BLADDER (UROLOGICAL SUPPLIES) IMPLANT
GLOVE BIOGEL M STRL SZ7.5 (GLOVE) ×4 IMPLANT
GOWN STRL REUS W/TWL LRG LVL3 (GOWN DISPOSABLE) ×4 IMPLANT
GUIDEWIRE STR DUAL SENSOR (WIRE) ×4 IMPLANT
KIT ASPIRATION TUBING (SET/KITS/TRAYS/PACK) IMPLANT
LOOPS RESECTOSCOPE DISP (ELECTROSURGICAL) ×4 IMPLANT
MANIFOLD NEPTUNE II (INSTRUMENTS) ×4 IMPLANT
PACK CYSTO (CUSTOM PROCEDURE TRAY) ×4 IMPLANT
STENT CONTOUR 6FRX24X.038 (STENTS) ×4 IMPLANT
SYRINGE IRR TOOMEY STRL 70CC (SYRINGE) IMPLANT
TUBING CONNECTING 10 (TUBING) ×3 IMPLANT
TUBING CONNECTING 10' (TUBING) ×1

## 2014-09-23 NOTE — Anesthesia Postprocedure Evaluation (Signed)
Anesthesia Post Note  Patient: Samuel Curtis  Procedure(s) Performed: Procedure(s) (LRB): TRANSURETHRAL RESECTION OF BLADDER TUMOR (TURBT) (N/A) CYSTOSCOPY WITH STENT PLACEMENT (Left)  Anesthesia type: General  Patient location: PACU  Post pain: Pain level controlled  Post assessment: Post-op Vital signs reviewed  Last Vitals: BP 145/91 mmHg  Pulse 82  Temp(Src) 36.3 C (Oral)  Resp 16  Ht 6' (1.829 m)  Wt 247 lb (112.038 kg)  BMI 33.49 kg/m2  SpO2 97%  Post vital signs: Reviewed  Level of consciousness: sedated  Complications: No apparent anesthesia complications

## 2014-09-23 NOTE — Anesthesia Preprocedure Evaluation (Addendum)
Anesthesia Evaluation  Patient identified by MRN, date of birth, ID band Patient awake    Reviewed: Allergy & Precautions, H&P , NPO status , Patient's Chart, lab work & pertinent test results  Airway Mallampati: II  TM Distance: >3 FB Neck ROM: full    Dental  (+) Chipped, Dental Advisory Given Chips on front upper:   Pulmonary neg pulmonary ROS, former smoker,  breath sounds clear to auscultation  Pulmonary exam normal       Cardiovascular Exercise Tolerance: Good hypertension, Pt. on medications + CAD, + Past MI, + Cardiac Stents and + Peripheral Vascular Disease Rhythm:regular Rate:Normal  MI 2011  S/p angioplasty and stents   Neuro/Psych  Headaches, negative neurological ROS  negative psych ROS   GI/Hepatic Neg liver ROS, GERD-  Medicated and Controlled,  Endo/Other  negative endocrine ROS  Renal/GU negative Renal ROS  negative genitourinary   Musculoskeletal  (+) Arthritis -,   Abdominal   Peds  Hematology negative hematology ROS (+)   Anesthesia Other Findings   Reproductive/Obstetrics negative OB ROS                            Anesthesia Physical  Anesthesia Plan  ASA: III  Anesthesia Plan: General   Post-op Pain Management:    Induction: Intravenous  Airway Management Planned: LMA  Additional Equipment: None  Intra-op Plan:   Post-operative Plan: Extubation in OR  Informed Consent: I have reviewed the patients History and Physical, chart, labs and discussed the procedure including the risks, benefits and alternatives for the proposed anesthesia with the patient or authorized representative who has indicated his/her understanding and acceptance.   Dental advisory given  Plan Discussed with: CRNA  Anesthesia Plan Comments:        Anesthesia Quick Evaluation

## 2014-09-23 NOTE — Interval H&P Note (Signed)
History and Physical Interval Note:  09/23/2014 1:41 PM  Samuel Curtis  has presented today for surgery, with the diagnosis of BLADDER CANCER  The various methods of treatment have been discussed with the patient and family. After consideration of risks, benefits and other options for treatment, the patient has consented to  Procedure(s): TRANSURETHRAL RESECTION OF BLADDER TUMOR (TURBT) (N/A) CYSTOSCOPY WITH STENT PLACEMENT (Left) as a surgical intervention .  The patient's history has been reviewed, patient examined, no change in status, stable for surgery.  I have reviewed the patient's chart and labs.  Questions were answered to the patient's satisfaction.     Juandavid Dallman,LES

## 2014-09-23 NOTE — Discharge Instructions (Signed)
1. You may see some blood in the urine and may have some burning with urination for 48-72 hours. You also may notice that you have to urinate more frequently or urgently after your procedure which is normal.  2. You should call should you develop an inability urinate, fever > 101, persistent nausea and vomiting that prevents you from eating or drinking to stay hydrated.  3. If you have a stent, you will likely urinate more frequently and urgently until the stent is removed and you may experience some discomfort/pain in the lower abdomen and flank especially when urinating. You may take pain medication prescribed to you if needed for pain. You may also intermittently have blood in the urine until the stent is removed. 4.

## 2014-09-23 NOTE — Transfer of Care (Signed)
Immediate Anesthesia Transfer of Care Note  Patient: Samuel Curtis  Procedure(s) Performed: Procedure(s): TRANSURETHRAL RESECTION OF BLADDER TUMOR (TURBT) (N/A) CYSTOSCOPY WITH STENT PLACEMENT (Left)  Patient Location: PACU  Anesthesia Type:General  Level of Consciousness: awake, sedated and patient cooperative  Airway & Oxygen Therapy: Patient Spontanous Breathing and Patient connected to face mask oxygen  Post-op Assessment: Report given to PACU RN and Post -op Vital signs reviewed and stable  Post vital signs: Reviewed and stable  Complications: No apparent anesthesia complications

## 2014-09-23 NOTE — Op Note (Signed)
Preoperative Diagnosis: bladder cancer  Postoperative Diagnosis:  same  Procedure(s) Performed:  Cystourethroscopy, Left ureteral stent placement, Left retrograde pyelogram, Transurethral resection of bladder tumor small and Intraoperative fluoroscopy with interpretation <1 hr  Surgeon:  Raynelle Bring, MD  Resident Surgeon:  Marta Antu, MD  Assistant(s):  none  Anesthesia:  General via LMA  Fluids:  See anesthesia record  Estimated blood loss:  5 mL  Specimens:   1. Multiple bladder tumors for pathology. Left lateral wall, recurrence near left UO, scarred old resection site on posterior wall, right lateral wall, and right anterior lateral wall. (all bladder tumors were between 0.5 cm and 2 cm)  Cultures:  none  Drains:  6x24cm left ureteral stent without string.  Complications:  none  Indications: 73yM with history of prostate CA S/p RALP (NED) as well as recent multifocal HGTa bladder cancer s/p resection and induction (+1 maintenance) BCG with recurrence on surveillance cysto. Here for resection. Last upper tract imaging in March of this year, with no upper tract pathology. His cytology from the office was negative.  Findings:   1. Multifocal small bladder tumors with narrow stalks, as described in specimens. In the posterior bladder wall, there were calcifications adherant to the scar which were removed. Given the calcifications, this area was sampled with loop electrocautery. 2. Left RPG showed no filling defects or hydronephrosis. 3. Given proximity of tumors to left UO, a stent was inserted and was in good position. 4. Good hemostasis at end of case.  Description:  The patient was correctly identified in the preop holding area where written informed consent as well potential risk and complication reviewed. He agreed. They were brought back to the operative suite where a preinduction timeout was performed. Once correct information was verified, general anesthesia was  induced via endotracheal tube. They were then gently placed into dorsal lithotomy position with SCDs in place for VTE prophylaxis. They were prepped and draped in the usual sterile fashion and given appropriate preoperative antibiotics.   We inserted a 67F rigid cystoscope per urethra with copious lubrication and normal saline irrigation running. This demonstrated findings as described above.    Cold cup bladder biopsy was used to resect all the tumors. The dense scar underlying the calcifications was too dense to allow cold cup, thus a 26Fr rigid resectascope was inserted and electrocautery used to excise this tissue.   Since the tumors were so close to the UO, and coagulation was necessary to obtain hemostasis, a stent was placed over a sensor wire under fluoroscopic guidance after a RPG had been performed.  Given the excellent hemostasis and minimal resection, we elected not to leave a catheter. Given his past high grade tumors, we elected not to instill mitomycin C.  The patient's bladder was emptied, they were awoken from anesthesia, and taken to the recovery room in good condition.   Post Op Plan:   1. Discharge patient home when meets PACU criteria. 2. F/u per path. 3. RTC for stent removal in 2 weeks.  Attestation:  Dr. Alinda Money was present and scrubbed for the entirety of the procedure.    James "Jed" Glo Herring, MD Resident, Department of Urology

## 2014-09-24 ENCOUNTER — Encounter (HOSPITAL_COMMUNITY): Payer: Self-pay | Admitting: Urology

## 2015-01-28 ENCOUNTER — Other Ambulatory Visit: Payer: Medicare Other

## 2015-01-28 ENCOUNTER — Ambulatory Visit: Payer: Medicare Other | Admitting: Vascular Surgery

## 2015-10-22 ENCOUNTER — Other Ambulatory Visit: Payer: Self-pay | Admitting: Urology

## 2015-11-05 ENCOUNTER — Encounter (HOSPITAL_COMMUNITY): Payer: Self-pay

## 2015-11-05 ENCOUNTER — Encounter (HOSPITAL_COMMUNITY)
Admission: RE | Admit: 2015-11-05 | Discharge: 2015-11-05 | Disposition: A | Discharge status: Home / Self Care | Payer: Medicare Other | Source: Ambulatory Visit | Attending: Urology | Admitting: Urology

## 2015-11-05 DIAGNOSIS — Z01812 Encounter for preprocedural laboratory examination: Secondary | ICD-10-CM | POA: Insufficient documentation

## 2015-11-05 DIAGNOSIS — C679 Malignant neoplasm of bladder, unspecified: Secondary | ICD-10-CM | POA: Insufficient documentation

## 2015-11-05 LAB — BASIC METABOLIC PANEL
Anion gap: 10 (ref 5–15)
BUN: 16 mg/dL (ref 6–20)
CALCIUM: 9 mg/dL (ref 8.9–10.3)
CHLORIDE: 103 mmol/L (ref 101–111)
CO2: 25 mmol/L (ref 22–32)
CREATININE: 0.76 mg/dL (ref 0.61–1.24)
GFR calc non Af Amer: >60 mL/min (ref >60)
Glucose, Bld: 94 mg/dL (ref 65–99)
Potassium: 4.6 mmol/L (ref 3.5–5.1)
Sodium: 138 mmol/L (ref 135–145)

## 2015-11-05 LAB — CBC
HCT: 45.1 % (ref 39.0–52.0)
Hemoglobin: 15.1 g/dL (ref 13.0–17.0)
MCH: 29.4 pg (ref 26.0–34.0)
MCHC: 33.5 g/dL (ref 30.0–36.0)
MCV: 87.7 fL (ref 78.0–100.0)
PLATELETS: 183 10*3/uL (ref 150–400)
RBC: 5.14 MIL/uL (ref 4.22–5.81)
RDW: 14 % (ref 11.5–15.5)
WBC: 7.6 10*3/uL (ref 4.0–10.5)

## 2015-11-05 NOTE — Patient Instructions (Signed)
Samuel Curtis  11/05/2015   Your procedure is scheduled on: 11-10-2015  Report to [facility]  Entrance take [facility]  elevators to 3rd floor to  [place] at  12 PM.  Call this number if you have problems the morning of surgery [phone]   Remember: ONLY 1 PERSON MAY GO WITH YOU TO SHORT STAY TO GET  READY MORNING OF [place].  Do not eat food after midnight.   Clear liquid diet until 8:00 AM.  Then nothing by mouth.    Take these medicines the morning of surgery with A SIP OF WATER: amlodipine (norvasc), Imdur, Prilosec                                You may not have any metal on your body including hair pins and              piercings  Do not wear jewelry,, lotions, powders or perfumes, deodorant                       Men may shave face and neck.   Do not bring valuables to the hospital. [place].  Contacts, dentures or bridgework may not be worn into surgery.      Patients discharged the day of surgery will not be allowed to drive home.  Name and phone number of your driver: wife  Special Instructions:  Coughing and deep breathing exercises, leg exercises              Please read over the following fact sheets you were given: _____________________________________________________________________             [facility] - Preparing for Surgery Before surgery, you can play an important role.  Because skin is not sterile, your skin needs to be as free of germs as possible.  You can reduce the number of germs on your skin by washing with CHG (chlorahexidine gluconate) soap before surgery.  CHG is an antiseptic cleaner which kills germs and bonds with the skin to continue killing germs even after washing. Please DO NOT use if you have an allergy to CHG or antibacterial soaps.  If your skin becomes reddened/irritated stop using the CHG and inform your nurse when you arrive at Short Stay. Do  not shave (including legs and underarms) for at least 48 hours prior to the first CHG shower.  You may shave your face/neck. Please follow these instructions carefully:  1.  Shower with CHG Soap the night before surgery and the  morning of Surgery.  2.  If you choose to wash your hair, wash your hair first as usual with your  normal  shampoo.  3.  After you shampoo, rinse your hair and body thoroughly to remove the  shampoo.                           4.  Use CHG as you would any other liquid soap.  You can apply chg directly  to the skin and wash                       Gently  with a scrungie or clean washcloth.  5.  Apply the CHG Soap to your body ONLY FROM THE NECK DOWN.   Do not use on face/ open                           Wound or open sores. Avoid contact with eyes, ears mouth and genitals (private parts).                       Wash face,  Genitals (private parts) with your normal soap.             6.  Wash thoroughly, paying special attention to the area where your surgery  will be performed.  7.  Thoroughly rinse your body with warm water from the neck down.  8.  DO NOT shower/wash with your normal soap after using and rinsing off  the CHG Soap.                9.  Pat yourself dry with a clean towel.            10.  Wear clean pajamas.            11.  Place clean sheets on your bed the night of your first shower and do not  sleep with pets. Day of Surgery : Do not apply any lotions/deodorants the morning of surgery.  Please wear clean clothes to the hospital/surgery center.  FAILURE TO FOLLOW THESE INSTRUCTIONS MAY RESULT IN THE CANCELLATION OF YOUR SURGERY PATIENT SIGNATURE_________________________________  NURSE SIGNATURE__________________________________  ________________________________________________________________________    CLEAR LIQUID DIET   Foods Allowed                                                                     Foods Excluded  Coffee and tea, regular and  decaf                             liquids that you cannot  Plain Jell-O in any flavor                                             see through such as: Fruit ices (not with fruit pulp)                                     milk, soups, orange juice  Iced Popsicles                                    All solid food Carbonated beverages, regular and diet                                    Cranberry, grape and apple juices Sports drinks like Gatorade Lightly seasoned clear broth or consume(fat free) Sugar, honey syrup  Sample  Menu Breakfast                                Lunch                                     Supper Cranberry juice                    Beef broth                            Chicken broth Jell-O                                     Grape juice                           Apple juice Coffee or tea                        Jell-O                                      Popsicle                                                Coffee or tea                        Coffee or tea  _____________________________________________________________________

## 2015-11-06 NOTE — Progress Notes (Signed)
10-20-15 - LOV - Dr. Luiz Ochoa (cardio) - in chart 10-20-15 - EKG - in chart  11-12-14 - Cardiac Cath. Report - in chart 03-05-14 - Carotid Duplex Bilateral - Echo - in chart

## 2015-11-07 NOTE — H&P (Signed)
History of Present Illness Samuel Curtis is 75 years old with the following urologic history:    1) Prostate cancer: He is s/p a UNS RAL radical prostatectomy on 12/03/09 for locally advanced prostate cancer with a positive surgical margin. He discussed the option of adjuvant radiation with Dr. Danny Lawless and chose to proceed with PSA surveillance and utilize radiation for salvage therapy if necessary.     TNM stage: pT3a N0 Mx   Gleason score: 3+4=7  Surgical margins: Positive (Focal in area of R EPE)  Pretreatment PSA: 4.55  Pretreatment SHIM: 7    2) Urothelial carcinoma: He presented with gross hematuria in March 2015 and was found to have numerous bladder tumors throughout the bladder on cystoscopy. Upper tract imaging was unremarkable.    Apr 2015: TUR - High grade Ta (high volume with multiple tumors)  Jun 2015: TUR - repeat resection due to high volume of tumors, persistent small amount of high grade Ta  Jul-Aug 2015: 6 week induction BCG  Oct 2015: Maintenance BCG  Jan 2016: TURBT (4 tumors removed) - 2 (high grade, Ta), 2 (low grade, Ta)  Feb-Mar 2016: Repeat induction BCG  Jul 2016: Maintenance BCG  Nov 2016: Mantenance BCG    3) Bladder neck calculus: He was noted to have a bladder neck calculus and underlying foreign body (staple) and underwent endoscopic removal in April 2015.    Interval history:    He follows up today for further evaluation of his bladder cancer. He states that he did tolerate maintenance BCG well after his last cystoscopy. He denies any recent hematuria. Unfortunately, he has been dealing with multiple stressful issues. He continues to help raise his 3 great grandchildren. He also unfortunately suffered the death of his son within the last couple of months.     Past Medical History Problems  1. History of Acute Myocardial Infarction 2. 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 History of  hypertension (Z86.79) 5. History of stroke (Z86.73) 6. Prostate cancer (C61)  Surgical History Problems  1. History of Cardiac Cath Procedure Outcome: 2. History of Cataract Surgery 3. History of Cath Stent Placement 4. History of Cystoscopy With Fragmentation Of Bladder Calculus 5. History of Cystoscopy With Fulguration Medium Lesion (2-5cm) 6. History of Cystoscopy With Fulguration Small Lesion (5-16mm) 7. History of Cystoscopy With Fulguration Small Lesion (5-27mm) 8. History of Cystoscopy With Insertion Of Ureteral Stent Left 9. History of Inguinal Hernia Repair 10. History of Prostatectomy Robotic-Assisted  Current Meds 1. AmLODIPine Besylate TABS;  Therapy: (Recorded:07Feb2011) to Recorded 2. Aspirin 81 MG TABS;  Therapy: (Recorded:16Jan2013) to Recorded 3. Furosemide 20 MG Oral Tablet;  Therapy: 23Apr2014 to Recorded 4. Hydrocodone-Acetaminophen 5-325 MG Oral Tablet;  Therapy: 13Apr2015 to Recorded 5. Isosorbide Mononitrate ER 30 MG Oral Tablet Extended Release 24 Hour;  Therapy: 20Aug2012 to Recorded 6. Isosorbide Mononitrate ER 60 MG Oral Tablet Extended Release 24 Hour;  Therapy: [id number] to Recorded 7. Metoprolol Tartrate 25 MG Oral Tablet;  Therapy: (Recorded:24Jun2016) to Recorded 8. Nitrostat 0.4 MG Sublingual Tablet Sublingual;  Therapy: (Recorded:23Dec2011) to Recorded 9. Omeprazole 40 MG Oral Capsule Delayed Release;  Therapy: [id number] to Recorded 10. Pravastatin Sodium 40 MG Oral Tablet;   Therapy: (Recorded:23Dec2011) to Recorded 11. PriLOSEC PACK;   Therapy: (Recorded:24Jun2016) to Recorded 12. RaNITidine HCl - 300 MG Oral Tablet;   Therapy: 13Sep2011 to Recorded  Allergies Medication  1. No Known Drug Allergies  Family History Problems  1. Family history of Acute Myocardial  Infarction : Brother 2. Family history of Nephrolithiasis : Father 3. Family history of Prostate Cancer : Brother  Social History Problems  1. Denied: History of  Alcohol Use (History) 2. Former smoker [phone]) 3. Marital History - Currently Married 4. Occupation:   retired 36. Tobacco Use   1 ppd x 20 years =quit 30 years  Physical Exam Constitutional: Well nourished and well developed . No acute distress.  Pulmonary: No respiratory distress, normal respiratory rhythm and effort and clear bilateral breath sounds.  Cardiovascular: Heart rate and rhythm are normal . No peripheral edema.  Genitourinary: Examination of the penis demonstrates no lesions and a normal meatus.    Results/Data Urine [Data Includes: Last 1 Day]   [id number]  COLOR AMBER   APPEARANCE CLEAR   SPECIFIC GRAVITY 1.030   pH 5.0   GLUCOSE NEGATIVE   BILIRUBIN NEGATIVE   KETONE 1+   BLOOD NEGATIVE   PROTEIN NEGATIVE   NITRITE NEGATIVE   LEUKOCYTE ESTERASE NEGATIVE    Procedure  Procedure: Cystoscopy  Chaperone Present: Heather S.  Indication: History of Urothelial Carcinoma.  Informed Consent: Risks, benefits, and potential adverse events were discussed and informed consent was obtained from the patient.  Prep: The patient was prepped with betadine.  Anesthesia:. Local anesthesia was administered intraurethrally with 2% lidocaine jelly.  Antibiotic prophylaxis: Ciprofloxacin.  Procedure Note:  Urethral meatus:. No abnormalities.  Anterior urethra: No abnormalities.  Prostatic urethra:. Surgically absent.  Bladder: Visulization was clear. The ureteral orifices were in the normal anatomic position bilaterally and had clear efflux of urine. Systematic examination of the bladder does reveal a small 1 cm papillary tumor on the left hemitrigone just lateral to the ureteral orifices well as a small papillary tumor on the left lateral bladder wall measuring less than 1 cm. No other definite bladder tumors are identified. A saline bladder washing was obtained and sent for cytologic analysis. The patient tolerated the procedure well.  Complications: None.     Assessment Assessed  1. Prostate cancer (C61) 2. Malignant neoplasm of overlapping sites of bladder (C67.8)  Plan Health Maintenance  1. UA With REFLEX; [Do Not Release]; Status:Complete;   [id number] 02:21PM Malignant neoplasm of overlapping sites of bladder  2. Follow-up Office  Follow-up  Status: Hold For - Appointment,Date of Service  Requested  for: [id number] 3. URINE CYTOLOGY; Status:In Progress - Specimen/Data Collected;   Done: [id number]  Discussion/Summary 1. High risk non-muscle invasive bladder cancer: He does have evidence of recurrence today. I have recommended proceeding with cystoscopy, bilateral retrograde pyelography, and transurethral resection of his bladder tumors. We reviewed the potential risks and complications as well as the expected recovery process. He gives his informed consent to proceed. He reiterates today that he is no longer taking Plavix and has been instructed to continue his aspirin 81 mg considering his history of a cardiac stent.    2. Prostate cancer: His next PSA is due for the summer of 2017.    Cc: Dr. Reesa Chew     Verified Results URINE CYTOLOGY1 [id number] 03:27PM1 Read Drivers  SPECIMEN TYPE: OTHER   Test Name Result Flag Reference  FINAL DIAGNOSIS:1     - NO MALIGNANT CELLS IDENTIFIED. NEGATIVE FOR HIGH-GRADE UROTHELIAL CARCINOMA.  SOURCE:1 Bladder Washing1    100CC OF CLEAR YELLOW BLW RECEIVED IN FIXATIVE 1 SLIDE PREPARED [phone]  Relevant Clinical Info1     MALIGNANT NEOPLASM OF OVERLAPPING SITES OF BLADDER  PATHOLOGIST:1     REVIEWED BY VALERIE J. FIELDS,  MD, FCAP (ELECTRONIC SIGNATURE ON FILE)  NUMBER OF SLIDES1     1 Container Submitted  CYTOTECHNOLOGIST:1     [facility], BS CT(ASCP)     1. Amended By: Raynelle Bring; Oct 16 2015 6:06 PM EST  Signatures Electronically signed by : Raynelle Bring, M.D.; Oct 16 2015  6:07PM EST

## 2015-11-10 ENCOUNTER — Ambulatory Visit (HOSPITAL_COMMUNITY)
Admission: RE | Admit: 2015-11-10 | Discharge: 2015-11-10 | Disposition: A | Discharge status: Home / Self Care | Payer: Medicare Other | Source: Ambulatory Visit | Attending: Urology | Admitting: Urology

## 2015-11-10 ENCOUNTER — Ambulatory Visit (HOSPITAL_COMMUNITY): Payer: Medicare Other | Admitting: Anesthesiology

## 2015-11-10 ENCOUNTER — Encounter (HOSPITAL_COMMUNITY): Payer: Self-pay | Admitting: *Deleted

## 2015-11-10 ENCOUNTER — Encounter (HOSPITAL_COMMUNITY)
Admission: RE | Disposition: A | Discharge status: Home / Self Care | Payer: Self-pay | Source: Ambulatory Visit | Attending: Urology

## 2015-11-10 DIAGNOSIS — C679 Malignant neoplasm of bladder, unspecified: Secondary | ICD-10-CM | POA: Insufficient documentation

## 2015-11-10 DIAGNOSIS — M1990 Unspecified osteoarthritis, unspecified site: Secondary | ICD-10-CM | POA: Insufficient documentation

## 2015-11-10 DIAGNOSIS — I10 Essential (primary) hypertension: Secondary | ICD-10-CM | POA: Insufficient documentation

## 2015-11-10 DIAGNOSIS — Z8546 Personal history of malignant neoplasm of prostate: Secondary | ICD-10-CM | POA: Diagnosis not present

## 2015-11-10 DIAGNOSIS — Z8673 Personal history of transient ischemic attack (TIA), and cerebral infarction without residual deficits: Secondary | ICD-10-CM | POA: Diagnosis not present

## 2015-11-10 DIAGNOSIS — I739 Peripheral vascular disease, unspecified: Secondary | ICD-10-CM | POA: Insufficient documentation

## 2015-11-10 DIAGNOSIS — D494 Neoplasm of unspecified behavior of bladder: Secondary | ICD-10-CM | POA: Diagnosis present

## 2015-11-10 DIAGNOSIS — Z79899 Other long term (current) drug therapy: Secondary | ICD-10-CM | POA: Insufficient documentation

## 2015-11-10 DIAGNOSIS — Z955 Presence of coronary angioplasty implant and graft: Secondary | ICD-10-CM | POA: Diagnosis not present

## 2015-11-10 DIAGNOSIS — K219 Gastro-esophageal reflux disease without esophagitis: Secondary | ICD-10-CM | POA: Diagnosis not present

## 2015-11-10 DIAGNOSIS — Z7982 Long term (current) use of aspirin: Secondary | ICD-10-CM | POA: Diagnosis not present

## 2015-11-10 DIAGNOSIS — I252 Old myocardial infarction: Secondary | ICD-10-CM | POA: Diagnosis not present

## 2015-11-10 DIAGNOSIS — I2510 Atherosclerotic heart disease of native coronary artery without angina pectoris: Secondary | ICD-10-CM | POA: Insufficient documentation

## 2015-11-10 DIAGNOSIS — Z87891 Personal history of nicotine dependence: Secondary | ICD-10-CM | POA: Diagnosis not present

## 2015-11-10 HISTORY — PX: CYSTOSCOPY W/ RETROGRADES: SHX1426

## 2015-11-10 HISTORY — PX: TRANSURETHRAL RESECTION OF BLADDER TUMOR: SHX2575

## 2015-11-10 SURGERY — CYSTOSCOPY, WITH RETROGRADE PYELOGRAM
Anesthesia: General

## 2015-11-10 MED ORDER — FENTANYL CITRATE (PF) 100 MCG/2ML IJ SOLN
INTRAMUSCULAR | Status: DC | PRN
  Administered 2015-11-10: 50 ug via INTRAVENOUS

## 2015-11-10 MED ORDER — PROPOFOL 10 MG/ML IV BOLUS
INTRAVENOUS | Status: DC | PRN
  Administered 2015-11-10: 150 mg via INTRAVENOUS
  Administered 2015-11-10: 30 mg via INTRAVENOUS

## 2015-11-10 MED ORDER — LIDOCAINE HCL (CARDIAC) 20 MG/ML IV SOLN
INTRAVENOUS | Status: DC | PRN
  Administered 2015-11-10: 50 mg via INTRAVENOUS

## 2015-11-10 MED ORDER — FENTANYL CITRATE (PF) 100 MCG/2ML IJ SOLN: 25.0000 ug | INTRAMUSCULAR | Status: DC | PRN

## 2015-11-10 MED ORDER — ONDANSETRON HCL 4 MG/2ML IJ SOLN
INTRAMUSCULAR | Status: AC
  Filled 2015-11-10: qty 2

## 2015-11-10 MED ORDER — FENTANYL CITRATE (PF) 100 MCG/2ML IJ SOLN
INTRAMUSCULAR | Status: AC
  Filled 2015-11-10: qty 2

## 2015-11-10 MED ORDER — LIDOCAINE HCL (CARDIAC) 20 MG/ML IV SOLN
INTRAVENOUS | Status: AC
  Filled 2015-11-10: qty 5

## 2015-11-10 MED ORDER — STERILE WATER FOR IRRIGATION IR SOLN
Status: DC | PRN
  Administered 2015-11-10: 2000 mL

## 2015-11-10 MED ORDER — LACTATED RINGERS IV SOLN
INTRAVENOUS | Status: DC | PRN
  Administered 2015-11-10: 13:00:00 via INTRAVENOUS

## 2015-11-10 MED ORDER — IOHEXOL 300 MG/ML  SOLN
INTRAMUSCULAR | Status: DC | PRN
  Administered 2015-11-10: 18 mL

## 2015-11-10 MED ORDER — PROPOFOL 10 MG/ML IV BOLUS
INTRAVENOUS | Status: AC
  Filled 2015-11-10: qty 20

## 2015-11-10 MED ORDER — OXYCODONE HCL 5 MG PO TABS: 5.0000 mg | ORAL_TABLET | Freq: Once | ORAL | Status: DC | PRN

## 2015-11-10 MED ORDER — CIPROFLOXACIN IN D5W 400 MG/200ML IV SOLN
400.0000 mg | INTRAVENOUS | Status: AC
  Administered 2015-11-10: 400 mg via INTRAVENOUS

## 2015-11-10 MED ORDER — ONDANSETRON HCL 4 MG/2ML IJ SOLN
INTRAMUSCULAR | Status: DC | PRN
  Administered 2015-11-10: 4 mg via INTRAVENOUS

## 2015-11-10 MED ORDER — 0.9 % SODIUM CHLORIDE (POUR BTL) OPTIME
TOPICAL | Status: DC | PRN
  Administered 2015-11-10: 1000 mL

## 2015-11-10 MED ORDER — OXYCODONE HCL 5 MG/5ML PO SOLN
5.0000 mg | Freq: Once | ORAL | Status: DC | PRN
  Filled 2015-11-10: qty 5

## 2015-11-10 MED ORDER — ACETAMINOPHEN 325 MG PO TABS
325.0000 mg | ORAL_TABLET | ORAL | Status: DC | PRN
Start: 2015-11-10 — End: 2015-11-10

## 2015-11-10 MED ORDER — ACETAMINOPHEN 160 MG/5ML PO SOLN: 325.0000 mg | ORAL | Status: DC | PRN

## 2015-11-10 MED ORDER — PHENAZOPYRIDINE HCL 100 MG PO TABS: 100.0000 mg | ORAL_TABLET | Freq: Three times a day (TID) | ORAL | Status: DC | PRN

## 2015-11-10 MED ORDER — SODIUM CHLORIDE 0.9 % IR SOLN
Status: DC | PRN
  Administered 2015-11-10: 3000 mL

## 2015-11-10 MED ORDER — CIPROFLOXACIN IN D5W 400 MG/200ML IV SOLN
INTRAVENOUS | Status: AC
  Filled 2015-11-10: qty 200

## 2015-11-10 SURGICAL SUPPLY — 18 items
BAG URINE DRAINAGE (UROLOGICAL SUPPLIES) IMPLANT
BAG URO CATCHER STRL LF (MISCELLANEOUS) ×4 IMPLANT
CATH INTERMIT  6FR 70CM (CATHETERS) IMPLANT
CLOTH BEACON ORANGE TIMEOUT ST (SAFETY) ×4 IMPLANT
ELECT REM PT RETURN 9FT ADLT (ELECTROSURGICAL)
ELECTRODE REM PT RTRN 9FT ADLT (ELECTROSURGICAL) IMPLANT
EVACUATOR MICROVAS BLADDER (UROLOGICAL SUPPLIES) IMPLANT
GLOVE BIOGEL M STRL SZ7.5 (GLOVE) ×4 IMPLANT
GOWN STRL REUS W/TWL LRG LVL3 (GOWN DISPOSABLE) ×8 IMPLANT
GUIDEWIRE STR DUAL SENSOR (WIRE) IMPLANT
KIT ASPIRATION TUBING (SET/KITS/TRAYS/PACK) IMPLANT
LOOP CUT BIPOLAR 24F LRG (ELECTROSURGICAL) IMPLANT
MANIFOLD NEPTUNE II (INSTRUMENTS) ×4 IMPLANT
PACK CYSTO (CUSTOM PROCEDURE TRAY) ×4 IMPLANT
SYRINGE IRR TOOMEY STRL 70CC (SYRINGE) IMPLANT
TUBING CONNECTING 10 (TUBING) ×3 IMPLANT
TUBING CONNECTING 10' (TUBING) ×1
TUBING INSUFFLATION 10FT LAP (TUBING) IMPLANT

## 2015-11-10 NOTE — Progress Notes (Signed)
asst up to void. Did well. Denies dizziness

## 2015-11-10 NOTE — Anesthesia Procedure Notes (Signed)
Procedure Name: LMA Insertion Date/Time: 11/10/2015 1:36 PM Performed by: Glory Buff Pre-anesthesia Checklist: Patient identified, Emergency Drugs available, Suction available and Patient being monitored Patient Re-evaluated:Patient Re-evaluated prior to inductionOxygen Delivery Method: Circle system utilized Preoxygenation: Pre-oxygenation with 100% oxygen Intubation Type: IV induction LMA: LMA inserted LMA Size: 4.0 Number of attempts: 1 Placement Confirmation: positive ETCO2 Tube secured with: Tape

## 2015-11-10 NOTE — Anesthesia Preprocedure Evaluation (Addendum)
Anesthesia Evaluation  Patient identified by MRN, date of birth, ID band Patient awake    Reviewed: Allergy & Precautions, H&P , NPO status , Patient's Chart, lab work & pertinent test results  History of Anesthesia Complications (+) MALIGNANT HYPERTHERMIANegative for: history of anesthetic complications  Airway Mallampati: II  TM Distance: >3 FB Neck ROM: full    Dental  (+) Chipped, Dental Advisory Given Chips on front upper:   Pulmonary neg pulmonary ROS, former smoker,    Pulmonary exam normal breath sounds clear to auscultation       Cardiovascular Exercise Tolerance: Good hypertension, Pt. on medications + CAD, + Past MI, + Cardiac Stents and + Peripheral Vascular Disease  Normal cardiovascular exam Rhythm:regular Rate:Normal  MI 2011  S/p angioplasty and stents   Neuro/Psych  Headaches, CVA, Residual Symptoms negative psych ROS   GI/Hepatic Neg liver ROS, GERD  Medicated and Controlled,  Endo/Other  negative endocrine ROS  Renal/GU negative Renal ROS  negative genitourinary   Musculoskeletal  (+) Arthritis ,   Abdominal   Peds  Hematology negative hematology ROS (+)   Anesthesia Other Findings   Reproductive/Obstetrics negative OB ROS                            Anesthesia Physical Anesthesia Plan  ASA: III  Anesthesia Plan: General   Post-op Pain Management:    Induction: Intravenous  Airway Management Planned: LMA  Additional Equipment: None  Intra-op Plan:   Post-operative Plan: Extubation in OR  Informed Consent: I have reviewed the patients History and Physical, chart, labs and discussed the procedure including the risks, benefits and alternatives for the proposed anesthesia with the patient or authorized representative who has indicated his/her understanding and acceptance.   Dental advisory given  Plan Discussed with: CRNA and Surgeon  Anesthesia Plan  Comments:         Anesthesia Quick Evaluation

## 2015-11-10 NOTE — Interval H&P Note (Signed)
History and Physical Interval Note:  11/10/2015 1:00 PM  Samuel Curtis  has presented today for surgery, with the diagnosis of BLADDER CANCER  The various methods of treatment have been discussed with the patient and family. After consideration of risks, benefits and other options for treatment, the patient has consented to  Procedure(s): CYSTOSCOPY WITH RETROGRADE PYELOGRAM (Bilateral) TRANSURETHRAL RESECTION OF BLADDER TUMOR (TURBT) (N/A) as a surgical intervention .  The patient's history has been reviewed, patient examined, no change in status, stable for surgery.  I have reviewed the patient's chart and labs.  Questions were answered to the patient's satisfaction.     Priya Matsen,LES

## 2015-11-10 NOTE — Op Note (Signed)
Preoperative diagnosis: 1. Bladder tumors (1cm, 1 cm)  Postoperative diagnosis:  1. Bladder tumors (1cm, 1 cm)  Procedure:  1. Cystoscopy 2. Transurethral resection of bladder tumor (1 cm) 3. Bilateral retrograde pyelography with interpretation  Surgeon: Pryor Curia. M.D.  Anesthesia: General  Complications: None  Intraoperative findings:  1. Bladder tumor: There were 2 small papillary bladder tumors noted measuring 1 centimetes each. There was one tumor that was located just to the left of the left ureteral orifice and a second tumor in the left lateral bladder wall. 2. Retrograde pyelography: retrograde pyelography was performed with 6 French ureteral catheters and Omnipaque contrast.  There were noted to be no filling defects or other abnormalities of the renal collecting systems or ureters bilaterally.  EBL: Minimal  Specimens: 1. Left hemitrigone bladder tumor 2. Left lateral bladder tumor  Disposition of specimens: Pathology  Indication: Samuel Curtis is a patient who was found to have a bladder tumor. After reviewing the management options for treatment, he elected to proceed with the above surgical procedure(s). We have discussed the potential benefits and risks of the procedure, side effects of the proposed treatment, the likelihood of the patient achieving the goals of the procedure, and any potential problems that might occur during the procedure or recuperation. Informed consent has been obtained.  Description of procedure:  The patient was taken to the operating room and general anesthesia was induced.  The patient was placed in the dorsal lithotomy position, prepped and draped in the usual sterile fashion, and preoperative antibiotics were administered. A preoperative time-out was performed.   Cystourethroscopy was performed.  The patient's urethra was examined and was normal.  The bladder was then systematically examined in its entirety. The ureteral  orifices were in their expected anatomic location.  Scarring was noted from his previous resection sites.  2 small 1 cm papillary bladder tumors were identified with one located just to the left of the left ureteral orifice and another off the left lateral bladder wall.  Attention then turned to the left ureteral orifice and a ureteral catheter was used to intubate the ureteral orifice.  Omnipaque contrast was injected through the ureteral catheter and a retrograde pyelogram was performed with findings as dictated above.  Attention then turned to the right ureteral orifice and a ureteral catheter was used to intubate the ureteral orifice.  Omnipaque contrast was injected through the ureteral catheter and a retrograde pyelogram was performed with findings as dictated above.  The bladder was then re-examined after the resectoscope was placed.  Using the cold cup biopsy forceps,each of these tumors was transurethrally resected. The Bugbee electrodewas then used to obtain hemostasis. It was sent for permanent pathologic analysis.   Hemostasis was then achieved with the loop cautery and the bladder was emptied and reinspected with no further bleeding noted at the end of the procedure.    The bladder was then emptied and the procedure ended.  The patient appeared to tolerate the procedure well and without complications.  The patient was able to be awakened and transferred to the recovery unit in satisfactory condition.    Pryor Curia MD

## 2015-11-10 NOTE — Discharge Instructions (Addendum)
1. You may see some blood in the urine and may have some burning with urination for 48-72 hours. You also may notice that you have to urinate more frequently or urgently after your procedure which is normal.  2. You should call should you develop an inability urinate, fever > 101, persistent nausea and vomiting that prevents you from eating or drinking to stay hydrated.          General Anesthesia, Adult, Care After Refer to this sheet in the next few weeks. These instructions provide you with information on caring for yourself after your procedure. Your health care provider may also give you more specific instructions. Your treatment has been planned according to current medical practices, but problems sometimes occur. Call your health care provider if you have any problems or questions after your procedure. WHAT TO EXPECT AFTER THE PROCEDURE After the procedure, it is typical to experience:  Sleepiness.  Nausea and vomiting. HOME CARE INSTRUCTIONS  For the first 24 hours after general anesthesia:  Have a responsible person with you.  Do not drive a car. If you are alone, do not take public transportation.  Do not drink alcohol.  Do not take medicine that has not been prescribed by your health care provider.  Do not sign important papers or make important decisions.  You may resume a normal diet and activities as directed by your health care provider.  If you have questions or problems that seem related to general anesthesia, call the hospital and ask for the anesthetist or anesthesiologist on call. SEEK MEDICAL CARE IF:  You have nausea and vomiting that continue the day after anesthesia.  You develop a rash. SEEK IMMEDIATE MEDICAL CARE IF:   You have difficulty breathing.  You have chest pain.  You have any allergic problems.   This information is not intended to replace advice given to you by your health care provider. Make sure you discuss any questions you have with  your health care provider.   Document Released: 12/06/2000 Document Revised: 09/20/2014 Document Reviewed: 12/29/2011 Elsevier Interactive Patient Education [employer].

## 2015-11-10 NOTE — Transfer of Care (Signed)
Immediate Anesthesia Transfer of Care Note  Patient: Samuel Curtis  Procedure(s) Performed: Procedure(s): CYSTOSCOPY WITH RETROGRADE PYELOGRAM (Bilateral) TRANSURETHRAL RESECTION OF BLADDER TUMOR (TURBT) (N/A)  Patient Location: PACU  Anesthesia Type:General  Level of Consciousness: awake, alert  and oriented  Airway & Oxygen Therapy: Patient Spontanous Breathing and Patient connected to face mask oxygen  Post-op Assessment: Report given to RN and Post -op Vital signs reviewed and stable  Post vital signs: Reviewed and stable  Last Vitals:  Filed Vitals:   11/10/15 1040  BP: 130/78  Pulse: 66  Temp: 36.6 C  Resp: 18    Complications: No apparent anesthesia complications

## 2015-11-10 NOTE — Anesthesia Postprocedure Evaluation (Signed)
Anesthesia Post Note  Patient: Samuel Curtis  Procedure(s) Performed: Procedure(s) (LRB): CYSTOSCOPY WITH RETROGRADE PYELOGRAM (Bilateral) TRANSURETHRAL RESECTION OF BLADDER TUMOR (TURBT) (N/A)  Patient location during evaluation: PACU Anesthesia Type: General Level of consciousness: awake and alert Pain management: pain level controlled Vital Signs Assessment: post-procedure vital signs reviewed and stable Respiratory status: spontaneous breathing, nonlabored ventilation, respiratory function stable and patient connected to nasal cannula oxygen Cardiovascular status: blood pressure returned to baseline and stable Postop Assessment: no signs of nausea or vomiting Anesthetic complications: no    Last Vitals:  Filed Vitals:   11/10/15 1552 11/10/15 1600  BP: 180/81 129/70  Pulse:    Temp:    Resp: 18     Last Pain:  Filed Vitals:   11/10/15 1601  PainSc: 0-No pain                 Calem Cocozza L

## 2015-11-11 ENCOUNTER — Encounter (HOSPITAL_COMMUNITY): Payer: Self-pay | Admitting: Urology

## 2016-03-18 ENCOUNTER — Other Ambulatory Visit: Payer: Self-pay | Admitting: Urology

## 2016-04-20 ENCOUNTER — Encounter (HOSPITAL_COMMUNITY): Payer: Self-pay

## 2016-04-20 NOTE — Progress Notes (Signed)
ekg with on dr Luiz Ochoa 2/17, cath 3/16, eccho 6/15,carotid duplex 6/15 all on chart

## 2016-04-20 NOTE — Patient Instructions (Addendum)
Samuel Curtis  04/20/2016   Your procedure is scheduled on: 8 14 17   Report to [facility] Main  Entrance take [facility]  elevators to 3rd floor to  [place] at 1:30 PM  Call this number if you have problems the morning of surgery [phone]   Remember: ONLY 1 PERSON MAY GO WITH YOU TO SHORT STAY TO GET  READY MORNING OF YOUR SURGERY.  Do not eat food :After Midnight. MAY HAVE CLEAR LIQUIDS MORNINIG OF   SURGERY UNTIL 09:30 AM--THEN NOTHING BY MOUTH _       Take these medicines the morning of surgery with A SIP OF WATER: AMLODIPINE, ISOSORBIDE,OMEPRAZOLE DO NOT TAKE ANY DIABETIC MEDICATIONS DAY OF YOUR SURGERY                               You may not have any metal on your body including hair pins and              piercings  Do not wear jewelry, make-up, lotions, powders or perfumes, deodorant             Do not wear nail polish.  Do not shave  48 hours prior to surgery.              Men may shave face and neck.   Do not bring valuables to the hospital. [place].  Contacts, dentures or bridgework may not be worn into surgery.  Leave suitcase in the car. After surgery it may be brought to your room.     Patients discharged the day of surgery will not be allowed to drive home.  Name and phone number of your driver:Verna    Wife QW:[phone]  Special Instructions: N/A              Please read over the following fact sheets you were given: _____________________________________________________________________             [facility] - Preparing for Surgery Before surgery, you can play an important role.  Because skin is not sterile, your skin needs to be as free of germs as possible.  You can reduce the number of germs on your skin by washing with CHG (chlorahexidine gluconate) soap before surgery.  CHG is an antiseptic cleaner which kills germs and bonds with the skin to continue killing germs even  after washing. Please DO NOT use if you have an allergy to CHG or antibacterial soaps.  If your skin becomes reddened/irritated stop using the CHG and inform your nurse when you arrive at Short Stay. Do not shave (including legs and underarms) for at least 48 hours prior to the first CHG shower.  You may shave your face/neck. Please follow these instructions carefully:  1.  Shower with CHG Soap the night before surgery and the  morning of Surgery.  2.  If you choose to wash your hair, wash your hair first as usual with your  normal  shampoo.  3.  After you shampoo, rinse your hair and body thoroughly to remove the  shampoo.                           4.  Use CHG as you would any other liquid soap.  You can apply chg directly  to the skin and wash                       Gently with a scrungie or clean washcloth.  5.  Apply the CHG Soap to your body ONLY FROM THE NECK DOWN.   Do not use on face/ open                           Wound or open sores. Avoid contact with eyes, ears mouth and genitals (private parts).                       Wash face,  Genitals (private parts) with your normal soap.             6.  Wash thoroughly, paying special attention to the area where your surgery  will be performed.  7.  Thoroughly rinse your body with warm water from the neck down.  8.  DO NOT shower/wash with your normal soap after using and rinsing off  the CHG Soap.                9.  Pat yourself dry with a clean towel.            10.  Wear clean pajamas.            11.  Place clean sheets on your bed the night of your first shower and do not  sleep with pets. Day of Surgery : Do not apply any lotions/deodorants the morning of surgery.  Please wear clean clothes to the hospital/surgery center.  FAILURE TO FOLLOW THESE INSTRUCTIONS MAY RESULT IN THE CANCELLATION OF YOUR SURGERY PATIENT SIGNATURE_________________________________  NURSE  SIGNATURE__________________________________  ________________________________________________________________________    CLEAR LIQUID DIET   Foods Allowed                                                                     Foods Excluded  Coffee and tea, regular and decaf                             liquids that you cannot  Plain Jell-O in any flavor                                             see through such as: Fruit ices (not with fruit pulp)                                     milk, soups, orange juice  Iced Popsicles                                    All solid food Carbonated beverages, regular and diet  Cranberry, grape and apple juices Sports drinks like Gatorade Lightly seasoned clear broth or consume(fat free) Sugar, honey syrup  Sample Menu Breakfast                                Lunch                                     Supper Cranberry juice                    Beef broth                            Chicken broth Jell-O                                     Grape juice                           Apple juice Coffee or tea                        Jell-O                                      Popsicle                                                Coffee or tea                        Coffee or tea  _____________________________________________________________________

## 2016-04-22 ENCOUNTER — Encounter (HOSPITAL_COMMUNITY): Payer: Self-pay

## 2016-04-22 ENCOUNTER — Encounter (HOSPITAL_COMMUNITY)
Admission: RE | Admit: 2016-04-22 | Discharge: 2016-04-22 | Disposition: A | Discharge status: Home / Self Care | Payer: Medicare Other | Source: Ambulatory Visit | Attending: Urology | Admitting: Urology

## 2016-04-22 DIAGNOSIS — K219 Gastro-esophageal reflux disease without esophagitis: Secondary | ICD-10-CM | POA: Insufficient documentation

## 2016-04-22 DIAGNOSIS — Z8673 Personal history of transient ischemic attack (TIA), and cerebral infarction without residual deficits: Secondary | ICD-10-CM | POA: Insufficient documentation

## 2016-04-22 DIAGNOSIS — E785 Hyperlipidemia, unspecified: Secondary | ICD-10-CM | POA: Insufficient documentation

## 2016-04-22 DIAGNOSIS — Z01812 Encounter for preprocedural laboratory examination: Secondary | ICD-10-CM | POA: Insufficient documentation

## 2016-04-22 DIAGNOSIS — I10 Essential (primary) hypertension: Secondary | ICD-10-CM | POA: Insufficient documentation

## 2016-04-22 DIAGNOSIS — I252 Old myocardial infarction: Secondary | ICD-10-CM | POA: Insufficient documentation

## 2016-04-22 LAB — CBC
HEMATOCRIT: 45 % (ref 39.0–52.0)
HEMOGLOBIN: 14.6 g/dL (ref 13.0–17.0)
MCH: 28.7 pg (ref 26.0–34.0)
MCHC: 32.4 g/dL (ref 30.0–36.0)
MCV: 88.4 fL (ref 78.0–100.0)
Platelets: 187 10*3/uL (ref 150–400)
RBC: 5.09 MIL/uL (ref 4.22–5.81)
RDW: 14.4 % (ref 11.5–15.5)
WBC: 6.9 10*3/uL (ref 4.0–10.5)

## 2016-04-22 LAB — BASIC METABOLIC PANEL
ANION GAP: 7 (ref 5–15)
BUN: 12 mg/dL (ref 6–20)
CALCIUM: 8.6 mg/dL — AB (ref 8.9–10.3)
CO2: 26 mmol/L (ref 22–32)
Chloride: 107 mmol/L (ref 101–111)
Creatinine, Ser: 0.73 mg/dL (ref 0.61–1.24)
GFR calc Af Amer: >60 mL/min (ref >60)
GFR calc non Af Amer: >60 mL/min (ref >60)
GLUCOSE: 103 mg/dL — AB (ref 65–99)
Potassium: 4.3 mmol/L (ref 3.5–5.1)
Sodium: 140 mmol/L (ref 135–145)

## 2016-04-22 NOTE — Progress Notes (Signed)
lov dr Luiz Ochoa 5/17 on chart

## 2016-04-26 ENCOUNTER — Ambulatory Visit (HOSPITAL_COMMUNITY): Payer: Medicare Other | Admitting: Anesthesiology

## 2016-04-26 ENCOUNTER — Ambulatory Visit (HOSPITAL_COMMUNITY)
Admission: RE | Admit: 2016-04-26 | Discharge: 2016-04-26 | Disposition: A | Discharge status: Home / Self Care | Payer: Medicare Other | Source: Ambulatory Visit | Attending: Urology | Admitting: Urology

## 2016-04-26 ENCOUNTER — Ambulatory Visit (HOSPITAL_COMMUNITY): Payer: Medicare Other

## 2016-04-26 ENCOUNTER — Encounter (HOSPITAL_COMMUNITY)
Admission: RE | Disposition: A | Discharge status: Home / Self Care | Payer: Self-pay | Source: Ambulatory Visit | Attending: Urology

## 2016-04-26 ENCOUNTER — Encounter (HOSPITAL_COMMUNITY): Payer: Self-pay | Admitting: *Deleted

## 2016-04-26 DIAGNOSIS — Z8673 Personal history of transient ischemic attack (TIA), and cerebral infarction without residual deficits: Secondary | ICD-10-CM | POA: Insufficient documentation

## 2016-04-26 DIAGNOSIS — Z87891 Personal history of nicotine dependence: Secondary | ICD-10-CM | POA: Diagnosis not present

## 2016-04-26 DIAGNOSIS — C674 Malignant neoplasm of posterior wall of bladder: Secondary | ICD-10-CM | POA: Diagnosis not present

## 2016-04-26 DIAGNOSIS — I252 Old myocardial infarction: Secondary | ICD-10-CM | POA: Diagnosis not present

## 2016-04-26 DIAGNOSIS — C678 Malignant neoplasm of overlapping sites of bladder: Secondary | ICD-10-CM | POA: Diagnosis present

## 2016-04-26 DIAGNOSIS — Z7982 Long term (current) use of aspirin: Secondary | ICD-10-CM | POA: Insufficient documentation

## 2016-04-26 DIAGNOSIS — I10 Essential (primary) hypertension: Secondary | ICD-10-CM | POA: Insufficient documentation

## 2016-04-26 DIAGNOSIS — Z79899 Other long term (current) drug therapy: Secondary | ICD-10-CM | POA: Diagnosis not present

## 2016-04-26 DIAGNOSIS — Z8546 Personal history of malignant neoplasm of prostate: Secondary | ICD-10-CM | POA: Insufficient documentation

## 2016-04-26 DIAGNOSIS — C679 Malignant neoplasm of bladder, unspecified: Secondary | ICD-10-CM

## 2016-04-26 HISTORY — PX: CYSTOSCOPY W/ RETROGRADES: SHX1426

## 2016-04-26 HISTORY — PX: TRANSURETHRAL RESECTION OF BLADDER TUMOR: SHX2575

## 2016-04-26 SURGERY — CYSTOSCOPY, WITH RETROGRADE PYELOGRAM
Anesthesia: General | Site: Ureter

## 2016-04-26 MED ORDER — CEFAZOLIN SODIUM-DEXTROSE 2-4 GM/100ML-% IV SOLN
INTRAVENOUS | Status: AC
  Filled 2016-04-26: qty 100

## 2016-04-26 MED ORDER — PROPOFOL 10 MG/ML IV BOLUS
INTRAVENOUS | Status: AC
  Filled 2016-04-26: qty 20

## 2016-04-26 MED ORDER — FENTANYL CITRATE (PF) 100 MCG/2ML IJ SOLN
INTRAMUSCULAR | Status: AC
  Administered 2016-04-26: 50 ug via INTRAVENOUS
  Filled 2016-04-26: qty 2

## 2016-04-26 MED ORDER — LACTATED RINGERS IV SOLN
INTRAVENOUS | Status: DC
  Administered 2016-04-26: 15:00:00 via INTRAVENOUS

## 2016-04-26 MED ORDER — ONDANSETRON HCL 4 MG/2ML IJ SOLN: 4.0000 mg | Freq: Once | INTRAMUSCULAR | Status: DC | PRN

## 2016-04-26 MED ORDER — OXYCODONE HCL 5 MG/5ML PO SOLN
5.0000 mg | Freq: Once | ORAL | Status: DC | PRN
  Filled 2016-04-26: qty 5

## 2016-04-26 MED ORDER — LIDOCAINE HCL (CARDIAC) 20 MG/ML IV SOLN
INTRAVENOUS | Status: DC | PRN
  Administered 2016-04-26: 100 mg via INTRAVENOUS

## 2016-04-26 MED ORDER — MITOMYCIN CHEMO FOR BLADDER INSTILLATION 40 MG: 40.0000 mg | Freq: Once | INTRAVENOUS | Status: DC

## 2016-04-26 MED ORDER — LIDOCAINE HCL (CARDIAC) 20 MG/ML IV SOLN
INTRAVENOUS | Status: AC
  Filled 2016-04-26: qty 5

## 2016-04-26 MED ORDER — MITOMYCIN CHEMO FOR BLADDER INSTILLATION 40 MG
40.0000 mg | Freq: Once | INTRAVENOUS | Status: AC
  Administered 2016-04-26: 40 mg via INTRAVESICAL
  Filled 2016-04-26: qty 40

## 2016-04-26 MED ORDER — FENTANYL CITRATE (PF) 100 MCG/2ML IJ SOLN
INTRAMUSCULAR | Status: DC | PRN
  Administered 2016-04-26: 25 ug via INTRAVENOUS
  Administered 2016-04-26: 50 ug via INTRAVENOUS
  Administered 2016-04-26: 25 ug via INTRAVENOUS

## 2016-04-26 MED ORDER — HYDROCODONE-ACETAMINOPHEN 5-325 MG PO TABS: 1.0000 | ORAL_TABLET | Freq: Four times a day (QID) | ORAL | 0 refills | Status: DC | PRN

## 2016-04-26 MED ORDER — CEFAZOLIN SODIUM-DEXTROSE 2-4 GM/100ML-% IV SOLN
2.0000 g | INTRAVENOUS | Status: AC
  Administered 2016-04-26: 2 g via INTRAVENOUS
  Filled 2016-04-26: qty 100

## 2016-04-26 MED ORDER — PHENAZOPYRIDINE HCL 100 MG PO TABS: 100.0000 mg | ORAL_TABLET | Freq: Three times a day (TID) | ORAL | 0 refills | Status: DC | PRN

## 2016-04-26 MED ORDER — FENTANYL CITRATE (PF) 100 MCG/2ML IJ SOLN
INTRAMUSCULAR | Status: AC
  Filled 2016-04-26: qty 2

## 2016-04-26 MED ORDER — PROPOFOL 10 MG/ML IV BOLUS
INTRAVENOUS | Status: DC | PRN
  Administered 2016-04-26: 200 mg via INTRAVENOUS

## 2016-04-26 MED ORDER — FENTANYL CITRATE (PF) 100 MCG/2ML IJ SOLN
25.0000 ug | INTRAMUSCULAR | Status: DC | PRN
Start: 2016-04-26 — End: 2016-04-26
  Administered 2016-04-26: 50 ug via INTRAVENOUS
  Administered 2016-04-26 (×2): 25 ug via INTRAVENOUS

## 2016-04-26 MED ORDER — ONDANSETRON HCL 4 MG/2ML IJ SOLN
INTRAMUSCULAR | Status: AC
  Filled 2016-04-26: qty 2

## 2016-04-26 MED ORDER — SODIUM CHLORIDE 0.9 % IR SOLN
Status: DC | PRN
  Administered 2016-04-26: 6000 mL

## 2016-04-26 MED ORDER — 0.9 % SODIUM CHLORIDE (POUR BTL) OPTIME
TOPICAL | Status: DC | PRN
  Administered 2016-04-26: 1000 mL

## 2016-04-26 MED ORDER — OXYCODONE HCL 5 MG PO TABS: 5.0000 mg | ORAL_TABLET | Freq: Once | ORAL | Status: DC | PRN

## 2016-04-26 MED ORDER — ONDANSETRON HCL 4 MG/2ML IJ SOLN
INTRAMUSCULAR | Status: DC | PRN
  Administered 2016-04-26: 4 mg via INTRAVENOUS

## 2016-04-26 SURGICAL SUPPLY — 17 items
BAG URINE DRAINAGE (UROLOGICAL SUPPLIES) IMPLANT
BAG URO CATCHER STRL LF (MISCELLANEOUS) ×4 IMPLANT
CATH INTERMIT  6FR 70CM (CATHETERS) ×4 IMPLANT
CLOTH BEACON ORANGE TIMEOUT ST (SAFETY) ×4 IMPLANT
ELECT REM PT RETURN 9FT ADLT (ELECTROSURGICAL) ×4
ELECTRODE REM PT RTRN 9FT ADLT (ELECTROSURGICAL) ×2 IMPLANT
EVACUATOR MICROVAS BLADDER (UROLOGICAL SUPPLIES) IMPLANT
GLOVE BIOGEL M STRL SZ7.5 (GLOVE) ×4 IMPLANT
GOWN STRL REUS W/TWL LRG LVL3 (GOWN DISPOSABLE) ×8 IMPLANT
GUIDEWIRE STR DUAL SENSOR (WIRE) ×4 IMPLANT
LOOP CUT BIPOLAR 24F LRG (ELECTROSURGICAL) ×4 IMPLANT
MANIFOLD NEPTUNE II (INSTRUMENTS) ×4 IMPLANT
PACK CYSTO (CUSTOM PROCEDURE TRAY) ×4 IMPLANT
SET ASPIRATION TUBING (TUBING) IMPLANT
SYRINGE IRR TOOMEY STRL 70CC (SYRINGE) ×4 IMPLANT
TUBING CONNECTING 10 (TUBING) ×3 IMPLANT
TUBING CONNECTING 10' (TUBING) ×1

## 2016-04-26 NOTE — Anesthesia Procedure Notes (Signed)
Procedure Name: LMA Insertion Date/Time: 04/26/2016 3:26 PM Performed by: Maxwell Caul Pre-anesthesia Checklist: Patient identified, Emergency Drugs available, Suction available and Patient being monitored Patient Re-evaluated:Patient Re-evaluated prior to inductionOxygen Delivery Method: Circle system utilized Preoxygenation: Pre-oxygenation with 100% oxygen Intubation Type: IV induction LMA: LMA inserted LMA Size: 5.0 Number of attempts: 1 Placement Confirmation: positive ETCO2,  CO2 detector and breath sounds checked- equal and bilateral Tube secured with: Tape Dental Injury: Teeth and Oropharynx as per pre-operative assessment

## 2016-04-26 NOTE — Discharge Instructions (Signed)
1. You may see some blood in the urine and may have some burning with urination for 48-72 hours. You also may notice that you have to urinate more frequently or urgently after your procedure which is normal.  °2. You should call should you develop an inability urinate, fever > 101, persistent nausea and vomiting that prevents you from eating or drinking to stay hydrated.  °

## 2016-04-26 NOTE — Anesthesia Preprocedure Evaluation (Addendum)
Anesthesia Evaluation  Patient identified by MRN, date of birth, ID band Patient awake    Reviewed: Allergy & Precautions, NPO status , Patient's Chart, lab work & pertinent test results  Airway Mallampati: II  TM Distance: >3 FB Neck ROM: Full    Dental  (+) Teeth Intact, Dental Advisory Given   Pulmonary former smoker,    breath sounds clear to auscultation       Cardiovascular hypertension,  Rhythm:Regular Rate:Normal     Neuro/Psych    GI/Hepatic   Endo/Other    Renal/GU      Musculoskeletal   Abdominal   Peds  Hematology   Anesthesia Other Findings   Reproductive/Obstetrics                            Anesthesia Physical Anesthesia Plan  ASA: III  Anesthesia Plan: General   Post-op Pain Management:    Induction: Intravenous  Airway Management Planned: LMA  Additional Equipment:   Intra-op Plan:   Post-operative Plan: Extubation in OR  Informed Consent: I have reviewed the patients History and Physical, chart, labs and discussed the procedure including the risks, benefits and alternatives for the proposed anesthesia with the patient or authorized representative who has indicated his/her understanding and acceptance.   Dental advisory given  Plan Discussed with: CRNA and Anesthesiologist  Anesthesia Plan Comments:         Anesthesia Quick Evaluation

## 2016-04-26 NOTE — H&P (Signed)
--------------------------------------------------------------------------------   Samuel Curtis  MRN: [id number]  PRIMARY CARE:    DOB: 31-Jul-1941, 75 year old Male  REFERRING:  Earnstine Regal, MD  SSN: -[id number]  PROVIDER:  Raynelle Bring, M.D.    LOCATION:  [facility] [phone]   --------------------------------------------------------------------------------   CC: Bladder CA cysto f/u  HPI: Samuel Curtis is a 75 year-old male with urothelial carcinoma: He presented with gross hematuria in March 2015 and was found to have numerous bladder tumors throughout the bladder on cystoscopy. Upper tract imaging was unremarkable.   Apr 2015: TUR - High grade Ta (high volume with multiple tumors)  Jun 2015: TUR - repeat resection due to high volume of tumors, persistent small amount of high grade Ta  Jul-Aug 2015: 6 week induction BCG  Oct 2015: Maintenance BCG  Jan 2016: TURBT (4 tumors removed) - 2 (high grade, Ta), 2 (low grade, Ta)  Feb-Mar 2016: Repeat induction BCG  Jul 2016: Maintenance BCG  Nov 2016: Mantenance BCG  Feb 2017: TURBT for 2 small tumors (L lateral wall and L hemitrigone): Low grade, Ta   He was noted to have a recurrence and presents today for treatment.   ALLERGIES: No Allergies    MEDICATIONS: AmLODIPine Besylate TABS Oral  Aspirin 81 MG TABS Oral  Furosemide 20 MG Oral Tablet Oral  Hydrocodone-Acetaminophen 5-325 MG Oral Tablet Oral  Isosorbide Mononitrate ER 30 MG Oral Tablet Extended Release 24 Hour Oral  Isosorbide Mononitrate ER 60 MG Oral Tablet Extended Release 24 Hour Oral  Metoprolol Tartrate 25 MG Oral Tablet Oral  Nitrostat 0.4 MG Sublingual Tablet Sublingual Sublingual  Omeprazole 40 MG Oral Capsule Delayed Release Oral  Pravastatin Sodium 40 MG Oral Tablet Oral  PriLOSEC PACK Oral  RaNITidine HCl - 300 MG Oral Tablet Oral     GU PSH: Cysto Bladder Stone <2.5cm - 2015 Cystoscopy Insert Stent - 10/08/2014 Cystoscopy TURBT  <2 cm - 11/11/2015, 10/08/2014, 2015 Cystoscopy TURBT 2-5 cm - 2015 Robotic Radical Prostatectomy - 2011      PSH Notes: Cystoscopy With Fulguration Small Lesion (5-68mm), Cystoscopy With Fulguration Small Lesion (5-7mm), Cystoscopy With Insertion Of Ureteral Stent Left, Cystoscopy With Fulguration Small Lesion (5-82mm), Cystoscopy With Fulguration Medium Lesion (2-5cm), Cystoscopy With Fragmentation Of Bladder Calculus, Cath Stent Placement, Prostatectomy Robotic-Assisted, Cardiac Cath Procedure Outcome:, Inguinal Hernia Repair, Cataract Surgery   NON-GU PSH: None   GU PMH: Bladder Cancer, overlapping sites, Malignant neoplasm of overlapping sites of bladder - 11/19/2015 Prostate Cancer, Prostate cancer - 10/07/2015 Urinary Tract Inf, Unspec site, Urinary tract infection - 04/02/2014 Bladder Stone, Bladder calculus - 2015 Gross hematuria, Gross hematuria - 2015 ED, arterial insufficiency, Erectile dysfunction due to arterial insufficiency - 2015 Splitting of Stream, Intermittent urinary stream - 2015 Other microscopic hematuria, Microscopic hematuria - 2014      PMH Notes:   1) Prostate cancer: He is s/p a UNS RAL radical prostatectomy on 12/03/09 for locally advanced prostate cancer with a positive surgical margin. He discussed the option of adjuvant radiation with Dr. Danny Lawless and chose to proceed with PSA surveillance and utilize radiation for salvage therapy if necessary.   TNM stage: pT3a N0 Mx  Gleason score: 3+4=7  Surgical margins: Positive (Focal in area of R EPE)  Pretreatment PSA: 4.55  Pretreatment SHIM: 7   2) Urothelial carcinoma: He presented with gross hematuria in March 2015 and was found to have numerous bladder tumors throughout the bladder on cystoscopy. Upper tract imaging was unremarkable.  Apr 2015: TUR - High grade Ta (high volume with multiple tumors)  Jun 2015: TUR - repeat resection due to high volume of tumors, persistent small amount of high grade Ta   Jul-Aug 2015: 6 week induction BCG  Oct 2015: Maintenance BCG  Jan 2016: TURBT (4 tumors removed) - 2 (high grade, Ta), 2 (low grade, Ta)  Feb-Mar 2016: Repeat induction BCG  Jul 2016: Maintenance BCG  Nov 2016: Mantenance BCG  Feb 2017: TURBT for 2 small tumors (L lateral wall and L hemitrigone): Low grade, Ta   3) Bladder neck calculus: He was noted to have a bladder neck calculus and underlying foreign body (staple) and underwent endoscopic removal in April 2015.     NON-GU PMH: Personal history of transient ischemic attack (TIA), and cerebral infarction without residual deficits, History of stroke - 03/19/2014 Aneurysm of iliac artery, Common iliac aneurysm - 2015 Personal history of other diseases of the circulatory system, History of hypertension - 2014 Personal history of other diseases of the nervous system and sense organs, History of glaucoma - 2014 , Acute Myocardial Infarction - 2011    FAMILY HISTORY: Acute Myocardial Infarction - Brother nephrolithiasis - Father Prostate Cancer - Brother   SOCIAL HISTORY: Marital Status: Married Current Smoking Status: Patient does not smoke anymore.  Social Drinker.  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     Notes: Former smoker, Occupation:, Tobacco Use, Marital History - Currently Married, Alcohol Use   REVIEW OF SYSTEMS:    GU Review Male:   Patient reports get up at night to urinate, leakage of urine, stream starts and stops, and trouble starting your streams. Patient denies frequent urination, hard to postpone urination, burning/ pain with urination, and have to strain to urinate .  Gastrointestinal (Lower):   Patient denies diarrhea and constipation.  Gastrointestinal (Upper):   Patient denies nausea and vomiting.  Constitutional:   Patient reports fatigue. Patient denies fever, night sweats, and weight loss.  Skin:   Patient denies skin rash/ lesion and itching.  Eyes:   Patient denies blurred vision and double vision.   Ears/ Nose/ Throat:   Patient denies sore throat and sinus problems.  Hematologic/Lymphatic:   Patient denies swollen glands and easy bruising.  Cardiovascular:   Patient denies leg swelling and chest pains.  Respiratory:   Patient denies cough and shortness of breath.  Endocrine:   Patient denies excessive thirst.  Musculoskeletal:   Patient denies back pain and joint pain.  Neurological:   due to medications. Patient reports dizziness. Patient denies headaches.  Psychologic:   Patient denies depression and anxiety.   VITAL SIGNS:    Weight 240 lb / 108.86 kg  Height 72 in / 182.88 cm  BMI 32.5 kg/m   MULTI-SYSTEM PHYSICAL EXAMINATION:    Constitutional: Well-nourished. No physical deformities. Normally developed. Good grooming.        ASSESSMENT:   1 GU:   Bladder Cancer, overlapping sites - C67.8     PLAN:     Bladder cancer: He does appear to have a recurrence. We discussed this finding today and I recommended proceeding with cystoscopy, bilateral retrograde pyelography, transurethral resection of his bladder tumor, and postoperative mitomycin C instillation intravesically. We have reviewed the potential complications and side effects of this surgery as well as the expected recovery process. He gives informed consent to proceed. We will assess his pathology and discuss ongoing follow-up.

## 2016-04-26 NOTE — Op Note (Signed)
Preoperative diagnosis: 1. Bladder tumor (1 cm)  Postoperative diagnosis:  1. Bladder tumor (1 cm)  Procedure:  1. Cystoscopy 2. Transurethral resection of bladder tumor (1 cm) 3. Bilateral retrograde pyelography with interpretation 4. Postoperative instillation of chemotherapy into the bladder  Surgeon: Pryor Curia. M.D.  Anesthesia: General  Complications: None  Intraoperative findings:  1. Bladder tumor: There were two 1 cm papillary tumors adjacent to each other toward the left posterior bladder. 2. Retrograde pyelography: No filling defects or other abnormalities noted of the bilateral renal collecting systems or ureters.  EBL: Minimal  Specimens: 1. Left posterior bladder tumor  Disposition of specimens: Pathology  Indication: Samuel Curtis is a patient who was found to have a bladder tumor. After reviewing the management options for treatment, he elected to proceed with the above surgical procedure(s). We have discussed the potential benefits and risks of the procedure, side effects of the proposed treatment, the likelihood of the patient achieving the goals of the procedure, and any potential problems that might occur during the procedure or recuperation. Informed consent has been obtained.  Description of procedure:  The patient was taken to the operating room and general anesthesia was induced.  The patient was placed in the dorsal lithotomy position, prepped and draped in the usual sterile fashion, and preoperative antibiotics were administered. A preoperative time-out was performed.   Cystourethroscopy was performed.  The patient's urethra was examined and was normal.  The prostatic urethra was surgically absent.   The bladder was then systematically examined in its entirety.  There were two small 1 cm papillary tumors located at the left posterior bladder without other tumors noted.   Attention then turned to the left ureteral orifice and a ureteral  catheter was used to intubate the ureteral orifice.  Omnipaque contrast was injected through the ureteral catheter and a retrograde pyelogram was performed with findings as dictated above.  Attention then turned to the right ureteral orifice and a ureteral catheter was used to intubate the ureteral orifice.  Omnipaque contrast was injected through the ureteral catheter and a retrograde pyelogram was performed with findings as dictated above.  The bladder was then re-examined after the resectoscope was placed.  The bladder tumors were 1 cm.  It was located at the left posterior bladder and appeared papillary. Using loop cautery resection, the entire tumor was resected and removed for permanent pathologic analysis along with underlying muscularis propria.  All visual tumor was resected.  Hemostasis was then achieved with the loop cautery and the bladder was emptied and reinspected with no further bleeding noted at the end of the procedure.    The bladder was then emptied and the procedure ended. A 16 Fr catheter was left in place for postop intravescial chemotherapy.  The patient appeared to tolerate the procedure well and without complications.  The patient was able to be awakened and transferred to the recovery unit in satisfactory condition.   In the PACU, I administered 40 mg of Mitomycin C in 40 cc of sterile water intravesically via a 16 Fr catheter. The chemotherapy was left indwelling for 1 hour and then drained.  The catheter was removed upon discharge.  Appropriate precautions were used.   Pryor Curia MD

## 2016-04-26 NOTE — Transfer of Care (Signed)
Immediate Anesthesia Transfer of Care Note  Patient: DEYLIN ADLEY  Procedure(s) Performed: Procedure(s) with comments: CYSTOSCOPY WITH RETROGRADE PYELOGRAM (Bilateral) - 1 HOUR TRANSURETHRAL RESECTION OF BLADDER TUMOR (TURBT) (N/A)  Patient Location: PACU  Anesthesia Type:General  Level of Consciousness: awake, alert , oriented and patient cooperative  Airway & Oxygen Therapy: Patient Spontanous Breathing and Patient connected to face mask oxygen  Post-op Assessment: Report given to RN, Post -op Vital signs reviewed and stable and Patient moving all extremities X 4  Post vital signs: stable  Last Vitals:  Vitals:   04/26/16 1324 04/26/16 1610  BP: (!) 153/92   Pulse: 68 71  Resp: 18 (P) 11  Temp: 36.4 C (P) 36.3 C    Last Pain:  Vitals:   04/26/16 1324  TempSrc: Oral      Patients Stated Pain Goal: 3 ([id number])  Complications: No apparent anesthesia complications

## 2016-04-27 ENCOUNTER — Encounter (HOSPITAL_COMMUNITY): Payer: Self-pay | Admitting: Urology

## 2016-04-29 NOTE — Anesthesia Postprocedure Evaluation (Signed)
Anesthesia Post Note  Patient: Samuel Curtis  Procedure(s) Performed: Procedure(s) (LRB): CYSTOSCOPY WITH RETROGRADE PYELOGRAM (Bilateral) TRANSURETHRAL RESECTION OF BLADDER TUMOR (TURBT) (N/A)  Patient location during evaluation: PACU Level of consciousness: awake, awake and alert and oriented Pain management: pain level controlled Vital Signs Assessment: post-procedure vital signs reviewed and stable Respiratory status: spontaneous breathing, nonlabored ventilation and respiratory function stable Cardiovascular status: blood pressure returned to baseline Anesthetic complications: no    Last Vitals:  Vitals:   04/26/16 1804 04/26/16 1835  BP: 126/75 (!) 144/74  Pulse: 62 (!) 59  Resp: 17 18  Temp: 36.3 C     Last Pain:  Vitals:   04/27/16 0856  TempSrc:   PainSc: 4                  Kennette Cuthrell,Alcide COKER

## 2017-01-03 IMAGING — RF DG ABDOMEN 1V
1 series · 2 of 2 positions shown · non-contrast
Comparison: 09/23/2014

CLINICAL DATA: Imaging acquired during retrograde pyelography for
bladder carcinoma.

EXAM:
ABDOMEN - 1 VIEW; DG C-ARM 1-60 MIN-NO REPORT

[Series 1: run · 2 of 2 slices shown]
[im 1/2]
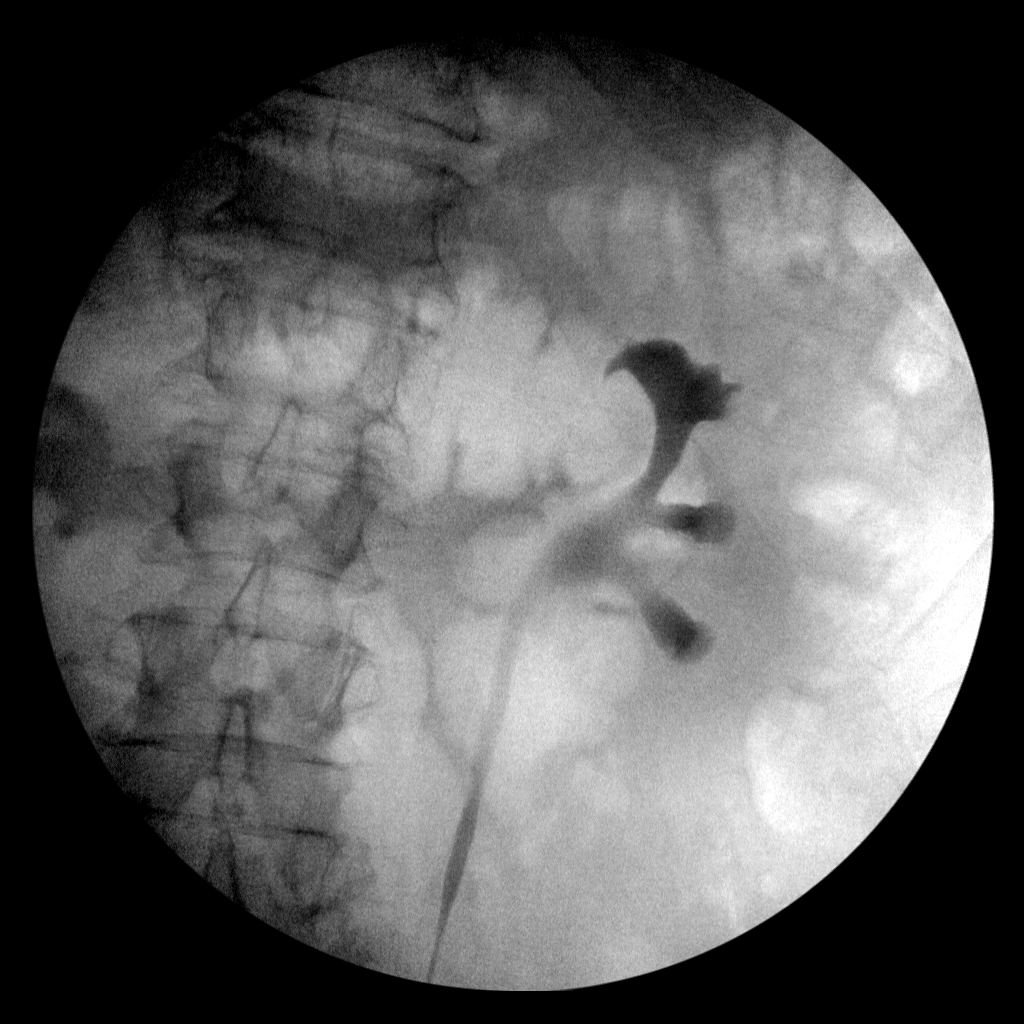
[im 2/2]
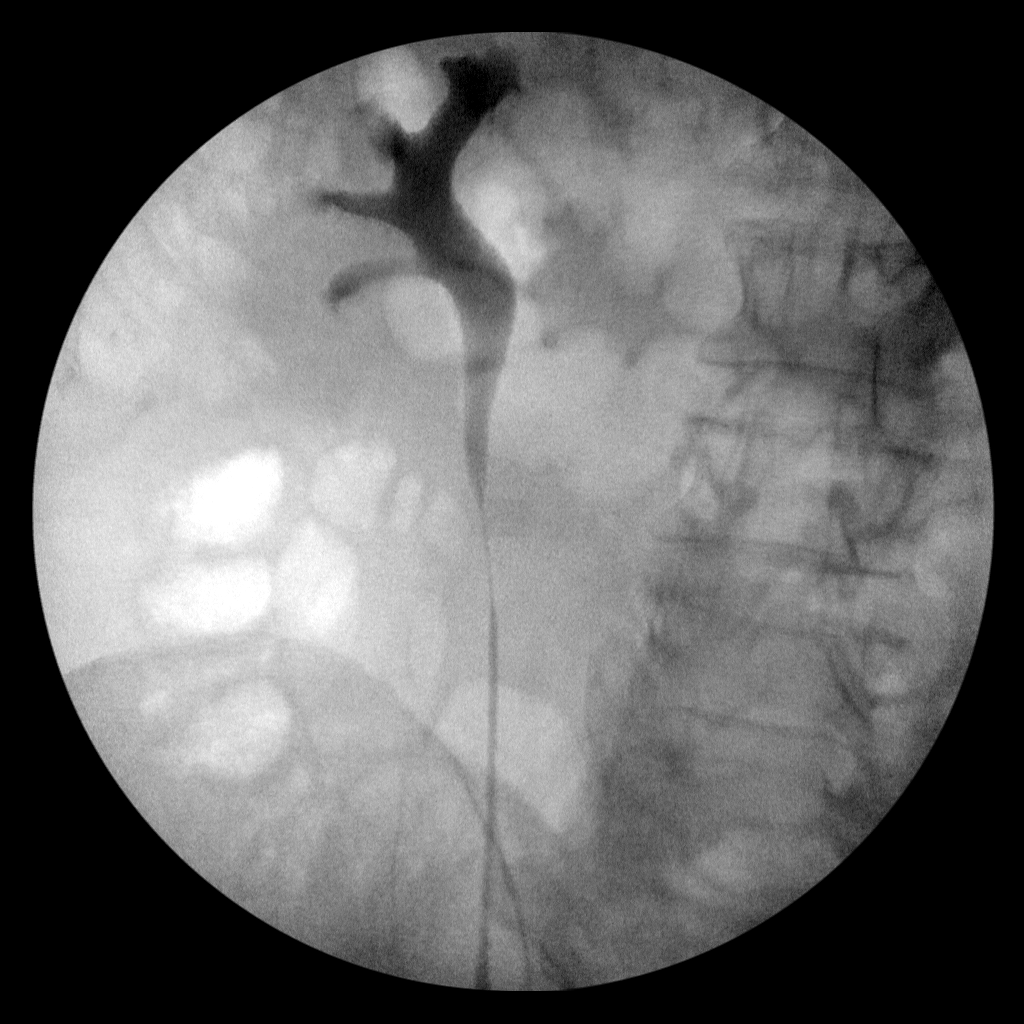

[2 of 2 positions shown; findings below may reference images not displayed]

FINDINGS: Two submitted images show the intrarenal collecting systems and
proximal ureters. There are normal in caliber with no filling
defects.
IMPRESSION: Imaging provided during retrograde ureteral pyelography in
cystography. Normal appearance of the intrarenal collecting systems.

## 2017-02-03 ENCOUNTER — Other Ambulatory Visit: Payer: Self-pay | Admitting: Urology

## 2017-02-15 NOTE — Patient Instructions (Addendum)
Samuel Curtis  02/15/2017   Your procedure is scheduled on: 02-21-17  Report to [facility] Main  Entrance Take [place]  elevators to 3rd floor to  [place] at 130 PM   Call this number if you have problems the morning of surgery [phone]    Remember: ONLY 1 PERSON MAY GO WITH YOU TO SHORT STAY TO GET  READY MORNING OF YOUR SURGERY.  Do not eat food  :After Midnight, MAY HAVE CLEAR LIQUIDS FROM MIDNIGHT UNTIL 930 AM DAY OF SURGERY 02-21-17, NOTHING BY MOUTH AFTER 930 AM    Take these medicines the morning of surgery with A SIP OF WATER: AMLODIPINE, ISOSORBIDE MONONITRATE              You may not have any metal on your body including hair pins and              piercings  Do not wear jewelry, make-up, lotions, powders or perfumes, deodorant             Do not wear nail polish.  Do not shave  48 hours prior to surgery.              Men may shave face and neck.   Do not bring valuables to the hospital. [place].  Contacts, dentures or bridgework may not be worn into surgery.  Leave suitcase in the car. After surgery it may be brought to your room.     Patients discharged the day of surgery will not be allowed to drive home.  Name and phone number of your driver: Lyda Jester [facility] [phone]  Special Instructions: N/A              Please read over the following fact sheets you were given: _____________________________________________________________________                CLEAR LIQUID DIET   Foods Allowed                                                                     Foods Excluded  Coffee and tea, regular and decaf                             liquids that you cannot  Plain Jell-O in any flavor                                             see through such as: Fruit ices (not with fruit pulp)                                     milk, soups, orange juice  Iced Popsicles  All solid food Carbonated beverages, regular and diet                                    Cranberry, grape and apple juices Sports drinks like Gatorade Lightly seasoned clear broth or consume(fat free) Sugar, honey syrup  Sample Menu Breakfast                                Lunch                                     Supper Cranberry juice                    Beef broth                            Chicken broth Jell-O                                     Grape juice                           Apple juice Coffee or tea                        Jell-O                                      Popsicle                                                Coffee or tea                        Coffee or tea  _____________________________________________________________________  [facility] - Preparing for Surgery Before surgery, you can play an important role.  Because skin is not sterile, your skin needs to be as free of germs as possible.  You can reduce the number of germs on your skin by washing with CHG (chlorahexidine gluconate) soap before surgery.  CHG is an antiseptic cleaner which kills germs and bonds with the skin to continue killing germs even after washing. Please DO NOT use if you have an allergy to CHG or antibacterial soaps.  If your skin becomes reddened/irritated stop using the CHG and inform your nurse when you arrive at Short Stay. Do not shave (including legs and underarms) for at least 48 hours prior to the first CHG shower.  You may shave your face/neck. Please follow these instructions carefully:  1.  Shower with CHG Soap the night before surgery and the  morning of Surgery.  2.  If you choose to wash your hair, wash your hair first as usual with your  normal  shampoo.  3.  After you shampoo, rinse your hair and body thoroughly to remove the  shampoo.  4.  Use CHG as you would any other liquid soap.  You can apply chg directly  to the skin and wash                        Gently with a scrungie or clean washcloth.  5.  Apply the CHG Soap to your body ONLY FROM THE NECK DOWN.   Do not use on face/ open                           Wound or open sores. Avoid contact with eyes, ears mouth and genitals (private parts).                       Wash face,  Genitals (private parts) with your normal soap.             6.  Wash thoroughly, paying special attention to the area where your surgery  will be performed.  7.  Thoroughly rinse your body with warm water from the neck down.  8.  DO NOT shower/wash with your normal soap after using and rinsing off  the CHG Soap.                9.  Pat yourself dry with a clean towel.            10.  Wear clean pajamas.            11.  Place clean sheets on your bed the night of your first shower and do not  sleep with pets. Day of Surgery : Do not apply any lotions/deodorants the morning of surgery.  Please wear clean clothes to the hospital/surgery center.  FAILURE TO FOLLOW THESE INSTRUCTIONS MAY RESULT IN THE CANCELLATION OF YOUR SURGERY PATIENT SIGNATURE_________________________________  NURSE SIGNATURE__________________________________  ________________________________________________________________________

## 2017-02-17 ENCOUNTER — Encounter (HOSPITAL_COMMUNITY)
Admission: RE | Admit: 2017-02-17 | Discharge: 2017-02-17 | Disposition: A | Discharge status: Home / Self Care | Payer: Medicare Other | Source: Ambulatory Visit | Attending: Urology | Admitting: Urology

## 2017-02-17 ENCOUNTER — Encounter (HOSPITAL_COMMUNITY): Payer: Self-pay

## 2017-02-17 DIAGNOSIS — I10 Essential (primary) hypertension: Secondary | ICD-10-CM | POA: Diagnosis not present

## 2017-02-17 DIAGNOSIS — Z8673 Personal history of transient ischemic attack (TIA), and cerebral infarction without residual deficits: Secondary | ICD-10-CM | POA: Insufficient documentation

## 2017-02-17 DIAGNOSIS — E785 Hyperlipidemia, unspecified: Secondary | ICD-10-CM | POA: Diagnosis not present

## 2017-02-17 DIAGNOSIS — K219 Gastro-esophageal reflux disease without esophagitis: Secondary | ICD-10-CM | POA: Diagnosis not present

## 2017-02-17 DIAGNOSIS — I252 Old myocardial infarction: Secondary | ICD-10-CM | POA: Diagnosis not present

## 2017-02-17 DIAGNOSIS — D494 Neoplasm of unspecified behavior of bladder: Secondary | ICD-10-CM | POA: Insufficient documentation

## 2017-02-17 DIAGNOSIS — I2510 Atherosclerotic heart disease of native coronary artery without angina pectoris: Secondary | ICD-10-CM | POA: Insufficient documentation

## 2017-02-17 DIAGNOSIS — N210 Calculus in bladder: Secondary | ICD-10-CM | POA: Insufficient documentation

## 2017-02-17 DIAGNOSIS — Z01812 Encounter for preprocedural laboratory examination: Secondary | ICD-10-CM | POA: Diagnosis present

## 2017-02-17 LAB — BASIC METABOLIC PANEL
ANION GAP: 8 (ref 5–15)
BUN: 16 mg/dL (ref 6–20)
CO2: 28 mmol/L (ref 22–32)
Calcium: 9 mg/dL (ref 8.9–10.3)
Chloride: 104 mmol/L (ref 101–111)
Creatinine, Ser: 0.84 mg/dL (ref 0.61–1.24)
GFR calc non Af Amer: >60 mL/min (ref >60)
GLUCOSE: 106 mg/dL — AB (ref 65–99)
POTASSIUM: 4.7 mmol/L (ref 3.5–5.1)
Sodium: 140 mmol/L (ref 135–145)

## 2017-02-17 LAB — CBC
HEMATOCRIT: 45.6 % (ref 39.0–52.0)
HEMOGLOBIN: 15.2 g/dL (ref 13.0–17.0)
MCH: 29.2 pg (ref 26.0–34.0)
MCHC: 33.3 g/dL (ref 30.0–36.0)
MCV: 87.7 fL (ref 78.0–100.0)
Platelets: 186 10*3/uL (ref 150–400)
RBC: 5.2 MIL/uL (ref 4.22–5.81)
RDW: 14.7 % (ref 11.5–15.5)
WBC: 7.6 10*3/uL (ref 4.0–10.5)

## 2017-02-17 LAB — URINALYSIS, ROUTINE W REFLEX MICROSCOPIC
BILIRUBIN URINE: NEGATIVE
GLUCOSE, UA: NEGATIVE mg/dL
KETONES UR: NEGATIVE mg/dL
NITRITE: POSITIVE — AB
Protein, ur: 30 mg/dL — AB
Specific Gravity, Urine: 1.02 (ref 1.005–1.030)
Squamous Epithelial / LPF: NONE SEEN
pH: 5 (ref 5.0–8.0)

## 2017-02-17 NOTE — Progress Notes (Signed)
UA RESULTS FAXED TO [facility] AND DR Clovia Cuff AND LEFT MESSAGE WITH SELITA BRADSHER VOICE MAIL.

## 2017-02-17 NOTE — Progress Notes (Signed)
SPOKE WITH HEATHER STEVEN CMA AND MADE AWARE PATIENT HAS BURNING AND PAIN WITH URINATION X 3 DAYS. ORDERS RECEIVED FROM DR Raynelle Bring FOR URINALYSIS AND URINE CULTURE, ORDER PLACED IN EPIC.

## 2017-02-17 NOTE — Progress Notes (Signed)
lov dr Cathren Laine on chart requested last ekg via fax on 02-16-17 fax confirmation received and placed on chart

## 2017-02-18 NOTE — H&P (Signed)
Office Visit Report     02/02/2017   --------------------------------------------------------------------------------   Early Chars. Knack  MRN: [id number]  PRIMARY CARE:    DOB: Dec 11, 1940, 76 year old Male  REFERRING:  Raynelle Bring, MD  SSN: -[id number]  PROVIDER:  Raynelle Bring, M.D.    LOCATION:  [facility] 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 He has denied any recent hematuria or change in voiding habits. His last resection was August 2017.     ALLERGIES: No Allergies    MEDICATIONS: AmLODIPine Besylate TABS Oral  Aspirin Ec 81 mg tablet, delayed release Oral  Furosemide 20 mg tablet Oral  Hydrocodone-Acetaminophen 5-325 MG Oral Tablet Oral  Isosorbide Mononitrate Er 30 mg tablet, extended release 24 hr Oral  Isosorbide Mononitrate Er 60 mg tablet, extended release 24 hr Oral  Metoprolol Tartrate 25 mg tablet Oral  Nitrostat 0.4 MG Sublingual Tablet Sublingual Sublingual  Omeprazole 40 MG Oral Capsule Delayed Release Oral  Pravastatin Sodium 40 MG Oral Tablet Oral  Prilosec 10 mg susp for recon,delayed rel. in a packet Oral  Ranitidine Hcl 300 mg tablet Oral     GU PSH: Bladder Instill AntiCA Agent - 09/02/2016, 08/26/2016, 08/19/2016, 06/07/2016, 05/31/2016, 05/24/2016, 04/26/2016 Cysto Bladder Stone <2.5cm - 2015 Cystoscopy - 11/03/2016, 08/03/2016, 03/12/2016 Cystoscopy Insert Stent - 2016 Cystoscopy TURBT <2 cm - 04/26/2016, 11/11/2015, 2016, 2015 Cystoscopy TURBT 2-5 cm - 2015 Robotic Radical Prostatectomy - 2011      PSH Notes: Cystoscopy With Fulguration Small Lesion (5-24mm), Cystoscopy With Fulguration Small Lesion (5-83mm), Cystoscopy With Insertion Of Ureteral Stent Left, Cystoscopy With Fulguration Small Lesion (5-58mm), Cystoscopy With Fulguration Medium Lesion (2-5cm),  Cystoscopy With Fragmentation Of Bladder Calculus, Cath Stent Placement, Prostatectomy Robotic-Assisted, Cardiac Cath Procedure Outcome:, Inguinal Hernia Repair, Cataract Surgery   NON-GU PSH: None   GU PMH: Bladder Cancer overlapping sites, Malignant neoplasm of overlapping sites of bladder - 11/19/2015 Prostate Cancer, Prostate cancer - 10/07/2015 Urinary Tract Inf, Unspec site, Urinary tract infection - 2015 Bladder Stone, Bladder calculus - 2015 Gross hematuria, Gross hematuria - 2015 ED due to arterial insufficiency, Erectile dysfunction due to arterial insufficiency - 2015 Splitting of urinary stream, Intermittent urinary stream - 2015 Other microscopic hematuria, Microscopic hematuria - 2014      PMH Notes:   1) Prostate cancer: He is s/p a UNS RAL radical prostatectomy on 12/03/09 for locally advanced prostate cancer with a positive surgical margin. He discussed the option of adjuvant radiation with Dr. Danny Lawless and chose to proceed with PSA surveillance and utilize radiation for salvage therapy if necessary.   TNM stage: pT3a N0 Mx  Gleason score: 3+4=7  Surgical margins: Positive (Focal in area of R EPE)  Pretreatment PSA: 4.55  Pretreatment SHIM: 7   2) Urothelial carcinoma: He presented with gross hematuria in March 2015 and was found to have numerous bladder tumors throughout the bladder on cystoscopy. Upper tract imaging was unremarkable.   Apr 2015: TUR - High grade Ta (high volume with multiple tumors)  Jun 2015: TUR - repeat resection due to high volume of tumors, persistent small amount of high grade Ta  Jul-Aug 2015: 6 week induction BCG  Oct 2015: Maintenance BCG  Jan 2016: TURBT (4 tumors removed) - 2 (high grade, Ta), 2 (low grade, Ta)  Feb-Mar 2016: Repeat induction BCG  Jul 2016: Maintenance BCG  Nov 2016: Mantenance BCG  Feb 2017:  TURBT for 2 small tumors (L lateral wall and L hemitrigone): Low grade, Ta  Aug 2017: TURBT for 2 small tumors (L posterior), Post  op [facility]: High grade, Ta  Sep 2017: 3 week maintenance BCG  Dec 2017: 3 week maintenance BCG   3) Bladder neck calculus: He was noted to have a bladder neck calculus and underlying foreign body (staple) and underwent endoscopic removal in April 2015.   NON-GU PMH: Personal history of transient ischemic attack (TIA), and cerebral infarction without residual deficits, History of stroke - 2015 Aneurysm of iliac artery, Common iliac aneurysm - 2015 Personal history of other diseases of the circulatory system, History of hypertension - 2014 Personal history of other diseases of the nervous system and sense organs, History of glaucoma - 2014 Myocardial Infarction    FAMILY HISTORY: Acute Myocardial Infarction - Brother nephrolithiasis - Father Prostate Cancer - Brother   SOCIAL HISTORY: Marital Status: Married Current Smoking Status: Patient does not smoke anymore.  Social Drinker.  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     Notes: Former smoker, Occupation:, Tobacco Use, Marital History - Currently Married, Alcohol Use   REVIEW OF SYSTEMS:    GU Review Male:   Patient denies frequent urination, hard to postpone urination, burning/ pain with urination, get up at night to urinate, leakage of urine, stream starts and stops, trouble starting your streams, and have to strain to urinate .  Gastrointestinal (Lower):   Patient denies diarrhea and constipation.  Gastrointestinal (Upper):   Patient denies nausea and vomiting.  Constitutional:   Patient denies weight loss, fever, fatigue, and night sweats.  Skin:   Patient denies skin rash/ lesion and itching.  Eyes:   Patient denies blurred vision and double vision.  Ears/ Nose/ Throat:   Patient denies sore throat and sinus problems.  Hematologic/Lymphatic:   Patient denies swollen glands and easy bruising.  Cardiovascular:   Patient denies leg swelling and chest pains.  Respiratory:   Patient denies cough and shortness of breath.   Endocrine:   Patient denies excessive thirst.  Musculoskeletal:   Patient denies back pain and joint pain.  Neurological:   Patient denies headaches and dizziness.  Psychologic:   Patient denies depression and anxiety.   VITAL SIGNS:      02/02/2017 02:44 PM  Weight 240 lb / 108.86 kg  Height 72 in / 182.88 cm  BP 147/80 mmHg  Pulse 68 /min  BMI 32.5 kg/m   GU PHYSICAL EXAMINATION:    Urethral Meatus: Normal size. No lesion, no wart, no discharge, no polyp. Normal location.   MULTI-SYSTEM PHYSICAL EXAMINATION:    Constitutional: Well-nourished. No physical deformities. Normally developed. Good grooming.     PAST DATA REVIEWED:  Source Of History:  Patient  Urine Test Review:   Urinalysis   03/12/16 03/08/15 06/01/14 11/16/13 05/04/13 11/03/12 04/15/12 09/29/11  PSA  Total PSA < 0.01  <0.01  <0.01  <0.01  <0.01  <0.01  <0.01  0.02     PROCEDURES:         Flexible Cystoscopy - 52000  Indication: Bladder cancer Risks, benefits, and potential complications of the procedure were discussed with the patient including infection, bleeding, voiding discomfort, urinary retention, fever, chills, sepsis, and others. All questions were answered. Informed consent was obtained. Sterile technique and intraurethral analgesia were used.  Meatus:  Normal size. Normal location. Normal condition.  Urethra:  No strictures.  External Sphincter:  Normal.  Verumontanum:  Verumontanum Surgically Absent.  Prostate:  Prostate  Surgically Absent.  Bladder Neck:  Non-obstructing.  Ureteral Orifices:  Normal location. Normal size. Normal shape. Effluxed clear urine.  Bladder:  Systematic examination of the bladder revealed a small less than 1 cm papillary tumor located toward the right posterior bladder towards the dome. No other tumors or other mucosal abnormalities were identified. A bladder washing was obtained for cytology.       Chaperone: AJ The procedure was well-tolerated and without  complications. Instructions were given to call the office immediately if questions or problems.         Urinalysis Dipstick Dipstick Cont'd  Color: Yellow Bilirubin: Neg  Appearance: Clear Ketones: 1+  Specific Gravity: 1.025 Blood: Neg  pH: 6.0 Protein: Neg  Glucose: Neg Urobilinogen: 0.2    Nitrites: Neg    Leukocyte Esterase: Neg    ASSESSMENT:      ICD-10 Details  1 GU:   Bladder Cancer overlapping sites - C67.8    PLAN:           Orders Labs Urine Cytology  Lab Notes: Washing          Schedule Return Visit/Planned Activity: Other See Visit Notes             Note: Will call to schedule surgery.          Document Letter(s):  Created for Patient: Clinical Summary         Notes:   1. High risk, non-muscle invasive bladder cancer: I discussed his cystoscopic findings indicating a small less than 1 cm papillary bladder tumor on the right side of the bladder. I have recommended that we proceed with cystoscopy and biopsy/resection considering his history of both high-grade and low-grade tumors. We have reviewed that procedure again in detail and he gives informed consent to proceed. We have reviewed the potential risks and complications as well as the expected recovery process. We then likely will consider his BCG later in the summer as I do think he is getting some benefit from BCG despite the fact he has had recurrences.   2. Prostate cancer: His next PSA would be due for around July 2018. We could potentially check this at his next postoperative visit.   Cc: Dr. Shawna Clamp    E & M CODE: I spent at least 15 minutes face to face with the patient, more than 50% of that time was spent on counseling and/or coordinating care.     * Signed by Raynelle Bring, M.D. on 02/02/17 at 6:40 PM (EDT)*

## 2017-02-18 NOTE — Progress Notes (Signed)
Preliminary ua results faxed to dr borden pod and medical records at [facility] and left message with selita bradsher to tell dr borden to check prelimary urine culture results

## 2017-02-19 LAB — URINE CULTURE: Culture: >=100000 — AB

## 2017-02-21 ENCOUNTER — Ambulatory Visit (HOSPITAL_COMMUNITY)
Admission: RE | Admit: 2017-02-21 | Discharge: 2017-02-21 | Disposition: A | Discharge status: Home / Self Care | Payer: Medicare Other | Source: Ambulatory Visit | Attending: Urology | Admitting: Urology

## 2017-02-21 ENCOUNTER — Encounter (HOSPITAL_COMMUNITY): Payer: Self-pay | Admitting: *Deleted

## 2017-02-21 ENCOUNTER — Encounter (HOSPITAL_COMMUNITY)
Admission: RE | Disposition: A | Discharge status: Home / Self Care | Payer: Self-pay | Source: Ambulatory Visit | Attending: Urology

## 2017-02-21 ENCOUNTER — Ambulatory Visit (HOSPITAL_COMMUNITY): Payer: Medicare Other | Admitting: Registered Nurse

## 2017-02-21 DIAGNOSIS — Z7982 Long term (current) use of aspirin: Secondary | ICD-10-CM | POA: Diagnosis not present

## 2017-02-21 DIAGNOSIS — K219 Gastro-esophageal reflux disease without esophagitis: Secondary | ICD-10-CM | POA: Insufficient documentation

## 2017-02-21 DIAGNOSIS — I10 Essential (primary) hypertension: Secondary | ICD-10-CM | POA: Insufficient documentation

## 2017-02-21 DIAGNOSIS — C679 Malignant neoplasm of bladder, unspecified: Secondary | ICD-10-CM | POA: Diagnosis present

## 2017-02-21 DIAGNOSIS — M1990 Unspecified osteoarthritis, unspecified site: Secondary | ICD-10-CM | POA: Insufficient documentation

## 2017-02-21 DIAGNOSIS — Z6839 Body mass index (BMI) 39.0-39.9, adult: Secondary | ICD-10-CM | POA: Diagnosis not present

## 2017-02-21 DIAGNOSIS — Z951 Presence of aortocoronary bypass graft: Secondary | ICD-10-CM | POA: Insufficient documentation

## 2017-02-21 DIAGNOSIS — Z87891 Personal history of nicotine dependence: Secondary | ICD-10-CM | POA: Insufficient documentation

## 2017-02-21 DIAGNOSIS — Z8546 Personal history of malignant neoplasm of prostate: Secondary | ICD-10-CM | POA: Diagnosis not present

## 2017-02-21 DIAGNOSIS — I2510 Atherosclerotic heart disease of native coronary artery without angina pectoris: Secondary | ICD-10-CM | POA: Diagnosis not present

## 2017-02-21 DIAGNOSIS — I252 Old myocardial infarction: Secondary | ICD-10-CM | POA: Diagnosis not present

## 2017-02-21 HISTORY — PX: TRANSURETHRAL RESECTION OF BLADDER TUMOR: SHX2575

## 2017-02-21 HISTORY — PX: CYSTOSCOPY: SHX5120

## 2017-02-21 SURGERY — TURBT (TRANSURETHRAL RESECTION OF BLADDER TUMOR)
Anesthesia: General

## 2017-02-21 MED ORDER — PROPOFOL 10 MG/ML IV BOLUS
INTRAVENOUS | Status: DC | PRN
  Administered 2017-02-21: 200 mg via INTRAVENOUS

## 2017-02-21 MED ORDER — DEXAMETHASONE SODIUM PHOSPHATE 10 MG/ML IJ SOLN
INTRAMUSCULAR | Status: DC | PRN
  Administered 2017-02-21: 10 mg via INTRAVENOUS

## 2017-02-21 MED ORDER — MIDAZOLAM HCL 2 MG/2ML IJ SOLN: 0.5000 mg | Freq: Once | INTRAMUSCULAR | Status: DC | PRN

## 2017-02-21 MED ORDER — SODIUM CHLORIDE 0.9 % IR SOLN
Status: DC | PRN
Start: 2017-02-21 — End: 2017-02-21
  Administered 2017-02-21: 3000 mL via INTRAVESICAL

## 2017-02-21 MED ORDER — FENTANYL CITRATE (PF) 100 MCG/2ML IJ SOLN
INTRAMUSCULAR | Status: AC
  Filled 2017-02-21: qty 2

## 2017-02-21 MED ORDER — MEPERIDINE HCL 50 MG/ML IJ SOLN: 6.2500 mg | INTRAMUSCULAR | Status: DC | PRN

## 2017-02-21 MED ORDER — PROPOFOL 10 MG/ML IV BOLUS
INTRAVENOUS | Status: AC
  Filled 2017-02-21: qty 20

## 2017-02-21 MED ORDER — PHENAZOPYRIDINE HCL 100 MG PO TABS: 100.0000 mg | ORAL_TABLET | Freq: Three times a day (TID) | ORAL | 0 refills | Status: AC | PRN

## 2017-02-21 MED ORDER — CEFAZOLIN SODIUM-DEXTROSE 2-4 GM/100ML-% IV SOLN
2.0000 g | INTRAVENOUS | Status: AC
  Administered 2017-02-21: 2 g via INTRAVENOUS
  Filled 2017-02-21: qty 100

## 2017-02-21 MED ORDER — PROMETHAZINE HCL 25 MG/ML IJ SOLN: 6.2500 mg | INTRAMUSCULAR | Status: DC | PRN

## 2017-02-21 MED ORDER — ONDANSETRON HCL 4 MG/2ML IJ SOLN
INTRAMUSCULAR | Status: DC | PRN
  Administered 2017-02-21: 4 mg via INTRAVENOUS

## 2017-02-21 MED ORDER — LACTATED RINGERS IV SOLN
INTRAVENOUS | Status: DC
  Administered 2017-02-21 (×3): via INTRAVENOUS

## 2017-02-21 MED ORDER — HYDROMORPHONE HCL 1 MG/ML IJ SOLN: 0.2500 mg | INTRAMUSCULAR | Status: DC | PRN

## 2017-02-21 MED ORDER — FENTANYL CITRATE (PF) 100 MCG/2ML IJ SOLN
INTRAMUSCULAR | Status: DC | PRN
  Administered 2017-02-21 (×2): 25 ug via INTRAVENOUS
  Administered 2017-02-21: 50 ug via INTRAVENOUS

## 2017-02-21 MED ORDER — ONDANSETRON HCL 4 MG/2ML IJ SOLN
INTRAMUSCULAR | Status: AC
  Filled 2017-02-21: qty 2

## 2017-02-21 MED ORDER — DEXAMETHASONE SODIUM PHOSPHATE 10 MG/ML IJ SOLN
INTRAMUSCULAR | Status: AC
Start: 2017-02-21 — End: ?
  Filled 2017-02-21: qty 1

## 2017-02-21 MED ORDER — LIDOCAINE HCL (CARDIAC) 20 MG/ML IV SOLN
INTRAVENOUS | Status: DC | PRN
  Administered 2017-02-21: 100 mg via INTRATRACHEAL

## 2017-02-21 MED ORDER — LIDOCAINE 2% (20 MG/ML) 5 ML SYRINGE
INTRAMUSCULAR | Status: AC
  Filled 2017-02-21: qty 5

## 2017-02-21 SURGICAL SUPPLY — 17 items
BAG URINE DRAINAGE (UROLOGICAL SUPPLIES) IMPLANT
BAG URO CATCHER STRL LF (MISCELLANEOUS) ×3 IMPLANT
CATH INTERMIT  6FR 70CM (CATHETERS) IMPLANT
CLOTH BEACON ORANGE TIMEOUT ST (SAFETY) ×3 IMPLANT
COVER SURGICAL LIGHT HANDLE (MISCELLANEOUS) IMPLANT
ELECT REM PT RETURN 15FT ADLT (MISCELLANEOUS) IMPLANT
GLOVE BIOGEL M STRL SZ7.5 (GLOVE) ×3 IMPLANT
GOWN STRL REUS W/TWL LRG LVL3 (GOWN DISPOSABLE) ×3 IMPLANT
GUIDEWIRE STR DUAL SENSOR (WIRE) IMPLANT
LOOP CUT BIPOLAR 24F LRG (ELECTROSURGICAL) ×3 IMPLANT
MANIFOLD NEPTUNE II (INSTRUMENTS) ×3 IMPLANT
NS IRRIG 1000ML POUR BTL (IV SOLUTION) ×3 IMPLANT
PACK CYSTO (CUSTOM PROCEDURE TRAY) ×3 IMPLANT
SET ASPIRATION TUBING (TUBING) IMPLANT
SYRINGE IRR TOOMEY STRL 70CC (SYRINGE) ×3 IMPLANT
TUBING CONNECTING 10 (TUBING) ×2 IMPLANT
TUBING CONNECTING 10' (TUBING) ×1

## 2017-02-21 NOTE — Anesthesia Preprocedure Evaluation (Addendum)
Anesthesia Evaluation  Patient identified by MRN, date of birth, ID band Patient awake    Reviewed: Allergy & Precautions, NPO status , Patient's Chart, lab work & pertinent test results  History of Anesthesia Complications Negative for: history of anesthetic complications  Airway Mallampati: II  TM Distance: >3 FB Neck ROM: Full    Dental  (+) Dental Advisory Given, Chipped   Pulmonary former smoker (quit 1975),    breath sounds clear to auscultation       Cardiovascular hypertension, Pt. on medications + CAD (2011 cath: non-obstructive), + Past MI (2011?), + Cardiac Stents (?? unclear) and + Peripheral Vascular Disease   Rhythm:Regular Rate:Normal  '15 ECHO: mild LVH with EF 56%, valves OK   Neuro/Psych CVA, No Residual Symptoms    GI/Hepatic Neg liver ROS, GERD  Medicated and Controlled,  Endo/Other  Morbid obesity  Renal/GU negative Renal ROS     Musculoskeletal  (+) Arthritis , Osteoarthritis,    Abdominal (+) + obese,   Peds  Hematology negative hematology ROS (+)   Anesthesia Other Findings   Reproductive/Obstetrics                            Anesthesia Physical Anesthesia Plan  ASA: III  Anesthesia Plan: General   Post-op Pain Management:    Induction: Intravenous  PONV Risk Score and Plan: 3 and Ondansetron, Dexamethasone, Propofol and Midazolam  Airway Management Planned: LMA  Additional Equipment:   Intra-op Plan:   Post-operative Plan:   Informed Consent: I have reviewed the patients History and Physical, chart, labs and discussed the procedure including the risks, benefits and alternatives for the proposed anesthesia with the patient or authorized representative who has indicated his/her understanding and acceptance.   Dental advisory given  Plan Discussed with: CRNA and Surgeon  Anesthesia Plan Comments: (Plan routine monitors, GA- LMA OK)         Anesthesia Quick Evaluation

## 2017-02-21 NOTE — Interval H&P Note (Signed)
History and Physical Interval Note:  02/21/2017 2:06 PM  Samuel Curtis  has presented today for surgery, with the diagnosis of BLADDER CANCER  The various methods of treatment have been discussed with the patient and family. After consideration of risks, benefits and other options for treatment, the patient has consented to  Procedure(s): TRANSURETHRAL RESECTION OF BLADDER TUMOR (TURBT) (N/A) CYSTOSCOPY (N/A) as a surgical intervention .  The patient's history has been reviewed, patient examined, no change in status, stable for surgery.  I have reviewed the patient's chart and labs.  Questions were answered to the patient's satisfaction.     Henriette Hesser,LES

## 2017-02-21 NOTE — Transfer of Care (Signed)
Immediate Anesthesia Transfer of Care Note  Patient: Samuel Curtis  Procedure(s) Performed: Procedure(s): TRANSURETHRAL RESECTION OF BLADDER TUMOR (TURBT) (N/A) CYSTOSCOPY (N/A)  Patient Location: PACU  Anesthesia Type:General  Level of Consciousness: awake, alert  and oriented  Airway & Oxygen Therapy: Patient Spontanous Breathing and Patient connected to face mask oxygen  Post-op Assessment: Report given to RN and Post -op Vital signs reviewed and stable  Post vital signs: Reviewed and stable  Last Vitals:  Vitals:   02/21/17 1311 02/21/17 1530  BP: 125/88   Pulse: 70   Resp: 16   Temp: 36.8 C (P) 36.4 C    Last Pain:  Vitals:   02/21/17 1311  TempSrc: Oral      Patients Stated Pain Goal: 3 (48/18/59 0931)  Complications: No apparent anesthesia complications

## 2017-02-21 NOTE — Anesthesia Procedure Notes (Signed)
Procedure Name: LMA Insertion Date/Time: 02/21/2017 2:57 PM Performed by: [place] Nirel Babler C Pre-anesthesia Checklist: Patient identified, Emergency Drugs available, Suction available, Patient being monitored and Timeout performed Patient Re-evaluated:Patient Re-evaluated prior to inductionOxygen Delivery Method: Circle system utilized Preoxygenation: Pre-oxygenation with 100% oxygen Intubation Type: IV induction LMA: LMA inserted LMA Size: 5.0 Number of attempts: 1 Dental Injury: Teeth and Oropharynx as per pre-operative assessment

## 2017-02-21 NOTE — Discharge Instructions (Addendum)
General Anesthesia, Adult, Care After These instructions provide you with information about caring for yourself after your procedure. Your health care provider may also give you more specific instructions. Your treatment has been planned according to current medical practices, but problems sometimes occur. Call your health care provider if you have any problems or questions after your procedure. What can I expect after the procedure? After the procedure, it is common to have:  Vomiting.  A sore throat.  Mental slowness.  It is common to feel:  Nauseous.  Cold or shivery.  Sleepy.  Tired.  Sore or achy, even in parts of your body where you did not have surgery.  Follow these instructions at home: For at least 24 hours after the procedure:  Do not: ? Participate in activities where you could fall or become injured. ? Drive. ? Use heavy machinery. ? Drink alcohol. ? Take sleeping pills or medicines that cause drowsiness. ? Make important decisions or sign legal documents. ? Take care of children on your own.  Rest. Eating and drinking  If you vomit, drink water, juice, or soup when you can drink without vomiting.  Drink enough fluid to keep your urine clear or pale yellow.  Make sure you have little or no nausea before eating solid foods.  Follow the diet recommended by your health care provider. General instructions  Have a responsible adult stay with you until you are awake and alert.  Return to your normal activities as told by your health care provider. Ask your health care provider what activities are safe for you.  Take over-the-counter and prescription medicines only as told by your health care provider.  If you smoke, do not smoke without supervision.  Keep all follow-up visits as told by your health care provider. This is important. Contact a health care provider if:  You continue to have nausea or vomiting at home, and medicines are not helpful.  You  cannot drink fluids or start eating again.  You cannot urinate after 8-12 hours.  You develop a skin rash.  You have fever.  You have increasing redness at the site of your procedure. Get help right away if:  You have difficulty breathing.  You have chest pain.  You have unexpected bleeding.  You feel that you are having a life-threatening or urgent problem. This information is not intended to replace advice given to you by your health care provider. Make sure you discuss any questions you have with your health care provider. Document Released: 12/06/2000 Document Revised: 02/02/2016 Document Reviewed: 08/14/2015 Elsevier Interactive Patient Education  2018 Samuel Curtis may see some blood in the urine and may have some burning with urination for 48-72 hours. You also may notice that you have to urinate more frequently or urgently after your procedure which is normal.  2. You should call should you develop an inability urinate, fever > 101, persistent nausea and vomiting that prevents you from eating or drinking to stay hydrated.

## 2017-02-21 NOTE — Op Note (Signed)
Preoperative diagnosis: Bladder cancer  Postoperative diagnosis: Bladder cancer  Procedures: 1.  Cystoscopy 2.  Transurethral resection of bladder tumor (1 cm)  Surgeon: Pryor Curia. M.D.  Anesthesia: General  Complications: None  EBL: Minimal  Specimen: Right posterior bladder tumor  Disposition of specimen: To pathology  Indication: Mr. Samuel Curtis is a 76 year old gentleman with a history of high-grade and low-grade urothelial carcinoma of the bladder.  He recently underwent surveillance cystoscopy in the office and was noted to have a concerning tumor site at the right posterior bladder.  The above procedures were recommended and he gives informed consent to proceed after discussion regarding the potential risks, complications, and we explored the recovery process.   Description of procedure: The patient was taken to the operating room and a general anesthetic was administered.  He was given preoperative antibiotics, placed in the dorsal lithotomy position, and prepped and draped in the usual sterile fashion.  Next, a preoperative timeout was performed.  Cystourethroscopy was performed with the 24 French resectoscope sheath.  The bladder was able to be examined in its entirety with both a 30 and 70 lens.  The previously mentioned tumor site was noted in the right posterior bladder.  No other abnormalities were identified.  Using loop bipolar resection, the previously mentioned small tumor was resected in its entirety.  This included underlying detrusor muscle.  Hemostasis was achieved with bipolar cautery.  The bladder was emptied and then reinspected.  Hemostasis appeared excellent.  The specimen was sent for permanent pathologic analysis.  The patient's bladder was then emptied and the procedure ended.  He tolerated the procedure well and without complications.  He was able to be awakened and transferred to recovery unit in satisfactory condition.

## 2017-02-21 NOTE — Anesthesia Postprocedure Evaluation (Signed)
Anesthesia Post Note  Patient: Samuel Curtis  Procedure(s) Performed: Procedure(s) (LRB): TRANSURETHRAL RESECTION OF BLADDER TUMOR (TURBT) (N/A) CYSTOSCOPY (N/A)     Patient location during evaluation: PACU Anesthesia Type: General Level of consciousness: awake and alert, patient cooperative and oriented Pain management: pain level controlled Vital Signs Assessment: post-procedure vital signs reviewed and stable Respiratory status: spontaneous breathing, nonlabored ventilation and respiratory function stable Cardiovascular status: blood pressure returned to baseline and stable Postop Assessment: no signs of nausea or vomiting Anesthetic complications: no    Last Vitals:  Vitals:   02/21/17 1646 02/21/17 1738  BP: (!) 144/90 (!) 143/88  Pulse: 68 69  Resp: 15 16  Temp: 36.3 C 36.6 C    Last Pain:  Vitals:   02/21/17 1738  TempSrc: Oral  PainSc: 0-No pain                 Mirela Parsley,E. Mixtli Reno

## 2017-02-22 ENCOUNTER — Encounter (HOSPITAL_COMMUNITY): Payer: Self-pay | Admitting: Urology
